# Patient Record
Sex: Male | Born: 1975 | Race: Black or African American | Hispanic: No | Marital: Single | Smoking: Current every day smoker
Health system: Southern US, Community
[De-identification: ages and names within clinical notes are randomized; demographics above are authoritative.]

## PROBLEM LIST (undated history)

## (undated) DIAGNOSIS — Z9889 Other specified postprocedural states: Secondary | ICD-10-CM

## (undated) HISTORY — PX: SHOULDER SURGERY: SHX246

---

## 2000-09-08 ENCOUNTER — Emergency Department (HOSPITAL_COMMUNITY): Admission: EM | Admit: 2000-09-08 | Discharge: 2000-09-08 | Payer: Self-pay | Admitting: Emergency Medicine

## 2000-09-08 ENCOUNTER — Encounter: Payer: Self-pay | Admitting: Emergency Medicine

## 2000-09-26 ENCOUNTER — Ambulatory Visit (HOSPITAL_COMMUNITY): Admission: RE | Admit: 2000-09-26 | Discharge: 2000-09-26 | Payer: Self-pay | Admitting: Orthopedic Surgery

## 2000-09-26 ENCOUNTER — Encounter: Payer: Self-pay | Admitting: Orthopedic Surgery

## 2000-10-10 ENCOUNTER — Observation Stay (HOSPITAL_COMMUNITY): Admission: RE | Admit: 2000-10-10 | Discharge: 2000-10-12 | Payer: Self-pay | Admitting: Orthopedic Surgery

## 2000-11-07 ENCOUNTER — Encounter (HOSPITAL_COMMUNITY): Admission: RE | Admit: 2000-11-07 | Discharge: 2000-12-07 | Payer: Self-pay | Admitting: Orthopedic Surgery

## 2001-06-23 ENCOUNTER — Emergency Department (HOSPITAL_COMMUNITY): Admission: EM | Admit: 2001-06-23 | Discharge: 2001-06-23 | Payer: Self-pay | Admitting: *Deleted

## 2002-04-19 ENCOUNTER — Emergency Department (HOSPITAL_COMMUNITY): Admission: EM | Admit: 2002-04-19 | Discharge: 2002-04-19 | Payer: Self-pay | Admitting: Emergency Medicine

## 2002-04-20 ENCOUNTER — Emergency Department (HOSPITAL_COMMUNITY): Admission: EM | Admit: 2002-04-20 | Discharge: 2002-04-20 | Payer: Self-pay | Admitting: Emergency Medicine

## 2003-03-11 ENCOUNTER — Emergency Department (HOSPITAL_COMMUNITY): Admission: EM | Admit: 2003-03-11 | Discharge: 2003-03-11 | Payer: Self-pay | Admitting: Emergency Medicine

## 2004-11-26 ENCOUNTER — Emergency Department (HOSPITAL_COMMUNITY): Admission: EM | Admit: 2004-11-26 | Discharge: 2004-11-26 | Payer: Self-pay | Admitting: *Deleted

## 2005-06-16 ENCOUNTER — Emergency Department (HOSPITAL_COMMUNITY): Admission: EM | Admit: 2005-06-16 | Discharge: 2005-06-17 | Payer: Self-pay | Admitting: Emergency Medicine

## 2005-08-31 ENCOUNTER — Emergency Department (HOSPITAL_COMMUNITY): Admission: EM | Admit: 2005-08-31 | Discharge: 2005-08-31 | Payer: Self-pay | Admitting: Emergency Medicine

## 2006-06-04 ENCOUNTER — Emergency Department (HOSPITAL_COMMUNITY): Admission: EM | Admit: 2006-06-04 | Discharge: 2006-06-04 | Payer: Self-pay | Admitting: Emergency Medicine

## 2006-06-11 ENCOUNTER — Emergency Department (HOSPITAL_COMMUNITY): Admission: EM | Admit: 2006-06-11 | Discharge: 2006-06-11 | Payer: Self-pay | Admitting: Emergency Medicine

## 2006-06-29 ENCOUNTER — Emergency Department (HOSPITAL_COMMUNITY): Admission: EM | Admit: 2006-06-29 | Discharge: 2006-06-29 | Payer: Self-pay | Admitting: Emergency Medicine

## 2006-06-30 ENCOUNTER — Ambulatory Visit: Payer: Self-pay | Admitting: Orthopedic Surgery

## 2006-07-08 ENCOUNTER — Ambulatory Visit: Payer: Self-pay | Admitting: Orthopedic Surgery

## 2006-08-11 ENCOUNTER — Ambulatory Visit: Payer: Self-pay | Admitting: Orthopedic Surgery

## 2006-10-02 ENCOUNTER — Ambulatory Visit: Payer: Self-pay | Admitting: Orthopedic Surgery

## 2008-03-24 ENCOUNTER — Emergency Department (HOSPITAL_COMMUNITY): Admission: EM | Admit: 2008-03-24 | Discharge: 2008-03-24 | Payer: Self-pay | Admitting: Emergency Medicine

## 2008-06-18 ENCOUNTER — Emergency Department (HOSPITAL_COMMUNITY): Admission: EM | Admit: 2008-06-18 | Discharge: 2008-06-18 | Payer: Self-pay | Admitting: Emergency Medicine

## 2010-04-23 ENCOUNTER — Emergency Department (HOSPITAL_COMMUNITY)
Admission: EM | Admit: 2010-04-23 | Discharge: 2010-04-24 | Disposition: A | Discharge status: Home / Self Care | Payer: Self-pay | Attending: Emergency Medicine | Admitting: Emergency Medicine

## 2010-04-23 DIAGNOSIS — T148XXA Other injury of unspecified body region, initial encounter: Secondary | ICD-10-CM | POA: Insufficient documentation

## 2010-04-23 DIAGNOSIS — X500XXA Overexertion from strenuous movement or load, initial encounter: Secondary | ICD-10-CM | POA: Insufficient documentation

## 2010-04-23 DIAGNOSIS — F17200 Nicotine dependence, unspecified, uncomplicated: Secondary | ICD-10-CM | POA: Insufficient documentation

## 2010-04-23 DIAGNOSIS — IMO0001 Reserved for inherently not codable concepts without codable children: Secondary | ICD-10-CM | POA: Insufficient documentation

## 2010-04-23 DIAGNOSIS — M549 Dorsalgia, unspecified: Secondary | ICD-10-CM | POA: Insufficient documentation

## 2010-04-23 DIAGNOSIS — Y929 Unspecified place or not applicable: Secondary | ICD-10-CM | POA: Insufficient documentation

## 2010-04-23 DIAGNOSIS — M25519 Pain in unspecified shoulder: Secondary | ICD-10-CM | POA: Insufficient documentation

## 2010-05-29 ENCOUNTER — Emergency Department (HOSPITAL_COMMUNITY)
Admission: EM | Admit: 2010-05-29 | Discharge: 2010-05-29 | Disposition: A | Discharge status: Home / Self Care | Payer: Self-pay | Attending: Emergency Medicine | Admitting: Emergency Medicine

## 2010-05-29 DIAGNOSIS — L0231 Cutaneous abscess of buttock: Secondary | ICD-10-CM | POA: Insufficient documentation

## 2010-06-02 LAB — CULTURE, ROUTINE-ABSCESS

## 2010-10-27 ENCOUNTER — Encounter: Payer: Self-pay | Admitting: *Deleted

## 2010-10-27 ENCOUNTER — Emergency Department (HOSPITAL_COMMUNITY): Payer: Self-pay

## 2010-10-27 ENCOUNTER — Emergency Department (HOSPITAL_COMMUNITY)
Admission: EM | Admit: 2010-10-27 | Discharge: 2010-10-27 | Disposition: A | Discharge status: Home / Self Care | Payer: Self-pay | Attending: Emergency Medicine | Admitting: Emergency Medicine

## 2010-10-27 DIAGNOSIS — M25469 Effusion, unspecified knee: Secondary | ICD-10-CM | POA: Insufficient documentation

## 2010-10-27 DIAGNOSIS — S0003XA Contusion of scalp, initial encounter: Secondary | ICD-10-CM | POA: Insufficient documentation

## 2010-10-27 DIAGNOSIS — F17200 Nicotine dependence, unspecified, uncomplicated: Secondary | ICD-10-CM | POA: Insufficient documentation

## 2010-10-27 DIAGNOSIS — S1093XA Contusion of unspecified part of neck, initial encounter: Secondary | ICD-10-CM | POA: Insufficient documentation

## 2010-10-27 DIAGNOSIS — M25569 Pain in unspecified knee: Secondary | ICD-10-CM | POA: Insufficient documentation

## 2010-10-27 DIAGNOSIS — S0083XA Contusion of other part of head, initial encounter: Secondary | ICD-10-CM

## 2010-10-27 DIAGNOSIS — IMO0002 Reserved for concepts with insufficient information to code with codable children: Secondary | ICD-10-CM | POA: Insufficient documentation

## 2010-10-27 DIAGNOSIS — M25461 Effusion, right knee: Secondary | ICD-10-CM

## 2010-10-27 MED ORDER — HYDROCODONE-ACETAMINOPHEN 5-325 MG PO TABS
1.0000 | ORAL_TABLET | Freq: Once | ORAL | Status: AC
  Administered 2010-10-27: 1 via ORAL
  Filled 2010-10-27: qty 1

## 2010-10-27 MED ORDER — HYDROCODONE-ACETAMINOPHEN 5-325 MG PO TABS: ORAL_TABLET | ORAL | Status: DC

## 2010-10-27 MED ORDER — IBUPROFEN 800 MG PO TABS
800.0000 mg | ORAL_TABLET | Freq: Once | ORAL | Status: AC
  Administered 2010-10-27: 800 mg via ORAL
  Filled 2010-10-27 (×2): qty 1

## 2010-10-27 MED ORDER — LIDOCAINE-EPINEPHRINE (PF) 1 %-1:200000 IJ SOLN
INTRAMUSCULAR | Status: AC
  Filled 2010-10-27: qty 10

## 2010-10-27 MED ORDER — TETANUS-DIPHTHERIA TOXOIDS TD 5-2 LFU IM INJ
0.5000 mL | INJECTION | Freq: Once | INTRAMUSCULAR | Status: AC
  Administered 2010-10-27: 0.5 mL via INTRAMUSCULAR
  Filled 2010-10-27: qty 0.5

## 2010-10-27 NOTE — ED Notes (Signed)
Pt c/o right knee and left shoulder pain. Pt has goose egg to left forehead.
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
## 2010-10-27 NOTE — ED Provider Notes (Signed)
Medical screening examination/treatment/procedure(s) were performed by non-physician practitioner and as supervising physician I was immediately available for consultation/collaboration.  Donnetta Hutching, MD 10/27/10 [phone]

## 2010-11-01 ENCOUNTER — Emergency Department (HOSPITAL_COMMUNITY)
Admission: EM | Admit: 2010-11-01 | Discharge: 2010-11-02 | Disposition: A | Discharge status: Home / Self Care | Payer: Self-pay | Attending: Emergency Medicine | Admitting: Emergency Medicine

## 2010-11-01 DIAGNOSIS — F17200 Nicotine dependence, unspecified, uncomplicated: Secondary | ICD-10-CM | POA: Insufficient documentation

## 2010-11-01 DIAGNOSIS — Z9889 Other specified postprocedural states: Secondary | ICD-10-CM | POA: Insufficient documentation

## 2010-11-01 DIAGNOSIS — M25569 Pain in unspecified knee: Secondary | ICD-10-CM | POA: Insufficient documentation

## 2010-11-01 DIAGNOSIS — M25469 Effusion, unspecified knee: Secondary | ICD-10-CM | POA: Insufficient documentation

## 2010-11-01 DIAGNOSIS — M7989 Other specified soft tissue disorders: Secondary | ICD-10-CM | POA: Insufficient documentation

## 2010-11-01 DIAGNOSIS — M79609 Pain in unspecified limb: Secondary | ICD-10-CM | POA: Insufficient documentation

## 2010-11-02 ENCOUNTER — Emergency Department (HOSPITAL_COMMUNITY): Payer: Self-pay

## 2011-01-29 ENCOUNTER — Emergency Department (HOSPITAL_COMMUNITY)
Admission: EM | Admit: 2011-01-29 | Discharge: 2011-01-29 | Disposition: A | Discharge status: Home / Self Care | Payer: Self-pay | Attending: Emergency Medicine | Admitting: Emergency Medicine

## 2011-01-29 ENCOUNTER — Encounter (HOSPITAL_COMMUNITY): Payer: Self-pay | Admitting: Emergency Medicine

## 2011-01-29 DIAGNOSIS — J029 Acute pharyngitis, unspecified: Secondary | ICD-10-CM | POA: Insufficient documentation

## 2011-01-29 DIAGNOSIS — F17200 Nicotine dependence, unspecified, uncomplicated: Secondary | ICD-10-CM | POA: Insufficient documentation

## 2011-01-29 MED ORDER — HYDROCODONE-ACETAMINOPHEN 5-325 MG PO TABS
1.0000 | ORAL_TABLET | Freq: Once | ORAL | Status: AC
  Administered 2011-01-29: 1 via ORAL
  Filled 2011-01-29: qty 1

## 2011-01-29 MED ORDER — PENICILLIN V POTASSIUM 500 MG PO TABS: 500.0000 mg | ORAL_TABLET | Freq: Four times a day (QID) | ORAL | Status: AC

## 2011-01-29 MED ORDER — HYDROCODONE-ACETAMINOPHEN 5-325 MG PO TABS: 1.0000 | ORAL_TABLET | Freq: Four times a day (QID) | ORAL | Status: AC | PRN

## 2011-01-29 MED ORDER — PENICILLIN V POTASSIUM 250 MG PO TABS
500.0000 mg | ORAL_TABLET | Freq: Once | ORAL | Status: AC
  Administered 2011-01-29: 500 mg via ORAL
  Filled 2011-01-29: qty 2

## 2011-01-29 MED ORDER — IBUPROFEN 800 MG PO TABS
800.0000 mg | ORAL_TABLET | Freq: Once | ORAL | Status: AC
  Administered 2011-01-29: 800 mg via ORAL
  Filled 2011-01-29: qty 1

## 2011-01-29 NOTE — ED Notes (Signed)
Pt c/o left side of throat soreness since this morning and states with low grade fever in triage

## 2011-01-29 NOTE — ED Notes (Signed)
Patient states he woke this morning with a "bad sore throat". States it is painful to swallow. Denies nausea or vomiting.

## 2011-01-29 NOTE — ED Provider Notes (Signed)
History     CSN: [id number] Arrival date & time: 01/29/2011  8:20 PM   First MD Initiated Contact with Patient 01/29/11 2022      Chief Complaint  Patient presents with  . Sore Throat    (Consider location/radiation/quality/duration/timing/severity/associated sxs/prior treatment) Patient is a 35 y.o. male presenting with pharyngitis. The history is provided by the patient and the spouse. No language interpreter was used.  Sore Throat This is a new problem. Episode onset: several days. The problem occurs constantly. The problem has been gradually worsening. Associated symptoms include a sore throat. Pertinent negatives include no chills or fever. The symptoms are aggravated by swallowing. He has tried acetaminophen and drinking for the symptoms. The treatment provided no relief.    History reviewed. No pertinent past medical history.  Past Surgical History  Procedure Date  . Shoulder surgery     History reviewed. No pertinent family history.  History  Substance Use Topics  . Smoking status: Current Everyday Smoker -- 1.0 packs/day  . Smokeless tobacco: Not on file  . Alcohol Use: Yes     occasionally      Review of Systems  Constitutional: Negative for fever and chills.  HENT: Positive for sore throat.   All other systems reviewed and are negative.    Allergies  Review of patient's allergies indicates no known allergies.  Home Medications   Current Outpatient Rx  Name Route Sig Dispense Refill  . CARBAMIDE PEROXIDE 6.5 % OT SOLN Left Ear Place 1-2 drops into the left ear 2 (two) times daily as needed. For pain       BP 140/93  Pulse 79  Temp(Src) 99 F (37.2 C) (Oral)  Resp 16  Ht 5\' 5"  (1.651 m)  Wt 130 lb (58.968 kg)  BMI 21.63 kg/m2  SpO2 100%  Physical Exam  Nursing note and vitals reviewed. Constitutional: He is oriented to person, place, and time. He appears well-developed and well-nourished.  HENT:  Head: Normocephalic and atraumatic. No  trismus in the jaw.  Right Ear: External ear normal.  Left Ear: External ear normal.  Nose: Nose normal.  Mouth/Throat: No uvula swelling. No oropharyngeal exudate or posterior oropharyngeal erythema.       Posterior pharynx difficult to visualize because of the pt's inability to lower his tongue or to allow full use of a tongue depressor.  It appears redder on the L than the R.  i don't perceive any fullness or uvular deviation.  Eyes: EOM are normal.  Neck: Normal range of motion and full passive range of motion without pain. Neck supple.  Cardiovascular: Normal rate, regular rhythm, normal heart sounds and intact distal pulses.   Pulmonary/Chest: Effort normal and breath sounds normal. No respiratory distress.  Abdominal: Soft. He exhibits no distension. There is no tenderness.  Musculoskeletal: Normal range of motion.  Neurological: He is alert and oriented to person, place, and time.  Skin: Skin is warm and dry.  Psychiatric: He has a normal mood and affect. Judgment normal.    ED Course  Procedures (including critical care time)   Labs Reviewed  RAPID STREP SCREEN   No results found.   No diagnosis found.    MDM          Worthy Rancher, PA 01/29/11 [phone]

## 2011-01-30 NOTE — ED Provider Notes (Signed)
Medical screening examination/treatment/procedure(s) were performed by non-physician practitioner and as supervising physician I was immediately available for consultation/collaboration.  Nicoletta Dress. Colon Branch, MD 01/30/11 1313

## 2012-06-02 ENCOUNTER — Encounter (HOSPITAL_COMMUNITY): Payer: Self-pay | Admitting: *Deleted

## 2012-06-02 ENCOUNTER — Emergency Department (HOSPITAL_COMMUNITY)
Admission: EM | Admit: 2012-06-02 | Discharge: 2012-06-03 | Disposition: A | Discharge status: Home / Self Care | Payer: Self-pay | Attending: Emergency Medicine | Admitting: Emergency Medicine

## 2012-06-02 DIAGNOSIS — N50811 Right testicular pain: Secondary | ICD-10-CM

## 2012-06-02 DIAGNOSIS — N452 Orchitis: Secondary | ICD-10-CM

## 2012-06-02 DIAGNOSIS — N509 Disorder of male genital organs, unspecified: Secondary | ICD-10-CM | POA: Insufficient documentation

## 2012-06-02 DIAGNOSIS — F17200 Nicotine dependence, unspecified, uncomplicated: Secondary | ICD-10-CM | POA: Insufficient documentation

## 2012-06-02 DIAGNOSIS — N453 Epididymo-orchitis: Secondary | ICD-10-CM | POA: Insufficient documentation

## 2012-06-02 NOTE — ED Notes (Signed)
C/o right testicle swelling and pain since sat after cleaning up trees outside

## 2012-06-03 ENCOUNTER — Ambulatory Visit (HOSPITAL_COMMUNITY)
Admit: 2012-06-03 | Discharge: 2012-06-03 | Disposition: A | Discharge status: Home / Self Care | Payer: Self-pay | Source: Ambulatory Visit | Attending: Emergency Medicine | Admitting: Emergency Medicine

## 2012-06-03 ENCOUNTER — Other Ambulatory Visit (HOSPITAL_COMMUNITY): Payer: Self-pay | Admitting: Emergency Medicine

## 2012-06-03 DIAGNOSIS — N433 Hydrocele, unspecified: Secondary | ICD-10-CM | POA: Insufficient documentation

## 2012-06-03 DIAGNOSIS — N509 Disorder of male genital organs, unspecified: Secondary | ICD-10-CM | POA: Insufficient documentation

## 2012-06-03 DIAGNOSIS — N452 Orchitis: Secondary | ICD-10-CM

## 2012-06-03 DIAGNOSIS — N508 Other specified disorders of male genital organs: Secondary | ICD-10-CM | POA: Insufficient documentation

## 2012-06-03 MED ORDER — AZITHROMYCIN 250 MG PO TABS
2000.0000 mg | ORAL_TABLET | Freq: Once | ORAL | Status: AC
  Administered 2012-06-03: 2000 mg via ORAL
  Filled 2012-06-03: qty 8

## 2012-06-03 MED ORDER — CEFTRIAXONE SODIUM 250 MG IJ SOLR
250.0000 mg | Freq: Once | INTRAMUSCULAR | Status: AC
  Administered 2012-06-03: 250 mg via INTRAMUSCULAR
  Filled 2012-06-03: qty 250

## 2012-06-03 NOTE — ED Notes (Signed)
MD at bedside. 

## 2012-06-03 NOTE — ED Provider Notes (Signed)
History     CSN: [id number]  Arrival date & time 06/02/12  1308   First MD Initiated Contact with Patient 06/02/12 2212      Chief Complaint  Patient presents with  . Inguinal Hernia    (Consider location/radiation/quality/duration/timing/severity/associated sxs/prior treatment) HPI Christian Ali is a 37 y.o. male presents with right testicle swelling and pain that started last Thursday while working outside. Says he was cleaning up trees and lifting heavy objects and he is concerned he has a hernia. His pain is severe, nonradiating, it is alleviated somewhat by wearing supportive underwear, this is been going on for days, he's had no vomiting, no fevers or chills. No penile discharge.    History reviewed. No pertinent past medical history.  Past Surgical History  Procedure Laterality Date  . Shoulder surgery      History reviewed. No pertinent family history.  History  Substance Use Topics  . Smoking status: Current Every Day Smoker -- 1.00 packs/day    Types: Cigarettes  . Smokeless tobacco: Not on file  . Alcohol Use: Yes     Comment: occasionally      Review of Systems At least 10pt or greater review of systems completed and are negative except where specified in the HPI.  Allergies  Review of patient's allergies indicates no known allergies.  Home Medications  No current outpatient prescriptions on file.  BP 117/70  Pulse 86  Temp(Src) 97.7 F (36.5 C) (Oral)  Resp 18  Ht 5\' 5"  (1.651 m)  Wt 148 lb (67.132 kg)  BMI 24.63 kg/m2  SpO2 100%  Physical Exam  Nursing notes reviewed.  Electronic medical record reviewed. VITAL SIGNS:   Filed Vitals:   06/02/12 2001 06/02/12 2004 06/03/12 0109  BP: 117/70  142/98  Pulse: 86  83  Temp: 97.7 F (36.5 C)    TempSrc: Oral    Resp: 18  20  Height: 5\' 5"  (1.651 m) 5\' 5"  (1.651 m)   Weight: 148 lb (67.132 kg) 148 lb (67.132 kg)   SpO2: 100%  99%   CONSTITUTIONAL: Awake, oriented, appears  non-toxic HENT: Atraumatic, normocephalic, oral mucosa pink and moist, airway patent. Nares patent without drainage. External ears normal. EYES: Conjunctiva clear, EOMI, PERRLA NECK: Trachea midline, non-tender, supple CARDIOVASCULAR: Normal heart rate, Normal rhythm, No murmurs, rubs, gallops PULMONARY/CHEST: Clear to auscultation, no rhonchi, wheezes, or rales. Symmetrical breath sounds. Non-tender. ABDOMINAL: Non-distended, soft, non-tender - no rebound or guarding.  BS normal. NEUROLOGIC: Non-focal, moving all four extremities, no gross sensory or motor deficits. GU: Normal circumcised male, no discharge, no rash or sores, right testicle is significantly enlarged compared to left testicle, it is firm and tender to palpation. Normal lie. No hernias appreciated. EXTREMITIES: No clubbing, cyanosis, or edema SKIN: Warm, Dry, No erythema, No rash  ED Course  Procedures (including critical care time)  Labs Reviewed - No data to display No results found.   1. Orchitis, right   2. Pain in right testicle       MDM  Physical examination suggest acute orchitis. Do not palpate a hernia. Bedside ultrasound examination shows a collection of fluid likely hydrocele, good blood flow to the right testicle, I do not suspect torsion.  We'll prescribe the patient antibiotics for likely orchitis and have him followup tomorrow with a non-emergent ultrasound.  I explained the diagnosis and have given explicit precautions to return to the ER including worsening pain, vomiting or any other new or worsening symptoms. The patient understands  and accepts the medical plan as it's been dictated and I have answered their questions. Discharge instructions concerning home care and prescriptions have been given.  The patient is STABLE and is discharged to home in good condition.         Jones Skene, MD 06/09/12 [phone]

## 2012-08-04 ENCOUNTER — Emergency Department (HOSPITAL_COMMUNITY)
Admission: EM | Admit: 2012-08-04 | Discharge: 2012-08-04 | Disposition: A | Discharge status: Home / Self Care | Payer: No Typology Code available for payment source | Attending: Emergency Medicine | Admitting: Emergency Medicine

## 2012-08-04 ENCOUNTER — Emergency Department (HOSPITAL_COMMUNITY): Payer: No Typology Code available for payment source

## 2012-08-04 ENCOUNTER — Encounter (HOSPITAL_COMMUNITY): Payer: Self-pay | Admitting: *Deleted

## 2012-08-04 DIAGNOSIS — Y9389 Activity, other specified: Secondary | ICD-10-CM | POA: Insufficient documentation

## 2012-08-04 DIAGNOSIS — S9000XA Contusion of unspecified ankle, initial encounter: Secondary | ICD-10-CM | POA: Insufficient documentation

## 2012-08-04 DIAGNOSIS — Z23 Encounter for immunization: Secondary | ICD-10-CM | POA: Insufficient documentation

## 2012-08-04 DIAGNOSIS — S39012A Strain of muscle, fascia and tendon of lower back, initial encounter: Secondary | ICD-10-CM

## 2012-08-04 DIAGNOSIS — S335XXA Sprain of ligaments of lumbar spine, initial encounter: Secondary | ICD-10-CM | POA: Insufficient documentation

## 2012-08-04 DIAGNOSIS — S9001XA Contusion of right ankle, initial encounter: Secondary | ICD-10-CM

## 2012-08-04 DIAGNOSIS — Y92410 Unspecified street and highway as the place of occurrence of the external cause: Secondary | ICD-10-CM | POA: Insufficient documentation

## 2012-08-04 DIAGNOSIS — F17200 Nicotine dependence, unspecified, uncomplicated: Secondary | ICD-10-CM | POA: Insufficient documentation

## 2012-08-04 MED ORDER — HYDROCODONE-ACETAMINOPHEN 5-325 MG PO TABS: 1.0000 | ORAL_TABLET | ORAL | Status: DC | PRN

## 2012-08-04 MED ORDER — TETANUS-DIPHTH-ACELL PERTUSSIS 5-2.5-18.5 LF-MCG/0.5 IM SUSP
0.5000 mL | Freq: Once | INTRAMUSCULAR | Status: AC
  Administered 2012-08-04: 0.5 mL via INTRAMUSCULAR
  Filled 2012-08-04: qty 0.5

## 2012-08-04 MED ORDER — NAPROXEN 375 MG PO TABS: 375.0000 mg | ORAL_TABLET | Freq: Two times a day (BID) | ORAL | Status: DC

## 2012-08-04 MED ORDER — BACITRACIN-NEOMYCIN-POLYMYXIN 400-5-5000 EX OINT
TOPICAL_OINTMENT | Freq: Once | CUTANEOUS | Status: DC
  Filled 2012-08-04: qty 2
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
## 2012-08-04 NOTE — ED Provider Notes (Signed)
Medical screening examination/treatment/procedure(s) were performed by non-physician practitioner and as supervising physician I was immediately available for consultation/collaboration. Devoria Albe, MD, Armando Gang   Ward Givens, MD 08/04/12 1910

## 2013-09-25 ENCOUNTER — Emergency Department (HOSPITAL_COMMUNITY)
Admission: EM | Admit: 2013-09-25 | Discharge: 2013-09-25 | Disposition: A | Discharge status: Home / Self Care | Payer: No Typology Code available for payment source | Attending: Emergency Medicine | Admitting: Emergency Medicine

## 2013-09-25 ENCOUNTER — Encounter (HOSPITAL_COMMUNITY): Payer: Self-pay | Admitting: Emergency Medicine

## 2013-09-25 DIAGNOSIS — L0201 Cutaneous abscess of face: Secondary | ICD-10-CM | POA: Insufficient documentation

## 2013-09-25 DIAGNOSIS — Z791 Long term (current) use of non-steroidal anti-inflammatories (NSAID): Secondary | ICD-10-CM | POA: Insufficient documentation

## 2013-09-25 DIAGNOSIS — L03211 Cellulitis of face: Principal | ICD-10-CM

## 2013-09-25 DIAGNOSIS — F17200 Nicotine dependence, unspecified, uncomplicated: Secondary | ICD-10-CM | POA: Insufficient documentation

## 2013-09-25 MED ORDER — LIDOCAINE-EPINEPHRINE (PF) 1 %-1:200000 IJ SOLN: 10.0000 mL | Freq: Once | INTRAMUSCULAR | Status: AC

## 2013-09-25 MED ORDER — LIDOCAINE-EPINEPHRINE (PF) 1 %-1:200000 IJ SOLN
INTRAMUSCULAR | Status: AC
  Administered 2013-09-25: 10 mL via INTRADERMAL
  Filled 2013-09-25: qty 10

## 2013-09-25 MED ORDER — SULFAMETHOXAZOLE-TMP DS 800-160 MG PO TABS: 1.0000 | ORAL_TABLET | Freq: Two times a day (BID) | ORAL | Status: DC

## 2013-09-25 MED ORDER — LIDOCAINE-EPINEPHRINE 2 %-1:100000 IJ SOLN
20.0000 mL | Freq: Once | INTRAMUSCULAR | Status: DC
  Filled 2013-09-25: qty 20

## 2013-09-25 MED ORDER — HYDROCODONE-ACETAMINOPHEN 5-325 MG PO TABS: ORAL_TABLET | ORAL | Status: DC

## 2013-09-25 NOTE — ED Provider Notes (Signed)
CSN: [id number]     Arrival date & time 09/25/13  1647 History   First MD Initiated Contact with Patient 09/25/13 1734     Chief Complaint  Patient presents with  . Abscess     (Consider location/radiation/quality/duration/timing/severity/associated sxs/prior Treatment) HPI  Christian Ali is a 38 y.o. male complaining of painful (8/10, no pain medication prior to arrival, exacerbated by palpation)  boil to right jaw worsening over the course of 3 days with purulent drainage. Patient denies personal or family history of diabetes, fever, chills, shortness of breath, nausea vomiting. Patient had similar episode with a gluteal abscess.  History reviewed. No pertinent past medical history. Past Surgical History  Procedure Laterality Date  . Shoulder surgery     No family history on file. History  Substance Use Topics  . Smoking status: Current Every Day Smoker -- 1.00 packs/day    Types: Cigarettes  . Smokeless tobacco: Not on file  . Alcohol Use: Yes     Comment: occasionally    Review of Systems  10 systems reviewed and found to be negative, except as noted in the HPI.   Allergies  Review of patient's allergies indicates no known allergies.  Home Medications   Prior to Admission medications   Medication Sig Start Date End Date Taking? Authorizing Provider  HYDROcodone-acetaminophen (NORCO/VICODIN) 5-325 MG per tablet Take 1 tablet by mouth every 4 (four) hours as needed. 08/04/12   Hope Orlene OchM Neese, NP  HYDROcodone-acetaminophen (NORCO/VICODIN) 5-325 MG per tablet Take 1-2 tablets by mouth every 6 hours as needed for pain. 09/25/13   Francis Yardley, PA-C  naproxen (NAPROSYN) 375 MG tablet Take 1 tablet (375 mg total) by mouth 2 (two) times daily. 08/04/12   Hope Orlene OchM Neese, NP  sulfamethoxazole-trimethoprim (BACTRIM DS) 800-160 MG per tablet Take 1 tablet by mouth 2 (two) times daily. 09/25/13   Calissa Swenor, PA-C   BP 145/91  Pulse 68  Temp(Src) 99.3 F (37.4 C) (Oral)   Resp 16  Ht 5\' 6"  (1.676 m)  Wt 140 lb (63.504 kg)  BMI 22.61 kg/m2  SpO2 98% Physical Exam  Nursing note and vitals reviewed. Constitutional: He is oriented to person, place, and time. He appears well-developed and well-nourished. No distress.  HENT:  Head: Normocephalic and atraumatic.    Mouth/Throat: Oropharynx is clear and moist.  Eyes: Conjunctivae and EOM are normal. Pupils are equal, round, and reactive to light.  Neck: Normal range of motion. Neck supple.  Cardiovascular: Normal rate, regular rhythm and intact distal pulses.   Pulmonary/Chest: Effort normal and breath sounds normal. No stridor. No respiratory distress. He has no wheezes. He has no rales. He exhibits no tenderness.  Abdominal: Soft.  Musculoskeletal: Normal range of motion.  Lymphadenopathy:    He has cervical adenopathy.  Neurological: He is alert and oriented to person, place, and time.  Psychiatric: He has a normal mood and affect.    ED Course  INCISION AND DRAINAGE Date/Time: 09/25/2013 6:22 PM Performed by: Wynetta EmeryPISCIOTTA, Keerthana Vanrossum Authorized by: Wynetta EmeryPISCIOTTA, Daira Hine Consent: Verbal consent obtained. Consent given by: patient Patient identity confirmed: verbally with patient Type: abscess Body area: head/neck Local anesthetic: lidocaine 1% with epinephrine Anesthetic total: 1 ml Patient sedated: no Scalpel size: 11 Incision type: elliptical Complexity: simple Drainage: purulent and  bloody Drainage amount: moderate Wound treatment: wound left open Packing material: none Patient tolerance: Patient tolerated the procedure well with no immediate complications.   (including critical care time) Labs Review Labs Reviewed - No data  to display  Imaging Review No results found.   EKG Interpretation None      MDM   Final diagnoses:  Facial abscess    Filed Vitals:   09/25/13 1657 09/25/13 1809  BP: 128/91 145/91  Pulse: 72 68  Temp: 99.3 F (37.4 C)   TempSrc: Oral   Resp: 18 16    Height: 5\' 6"  (1.676 m)   Weight: 140 lb (63.504 kg)   SpO2: 99% 98%    Medications  lidocaine-EPINEPHrine (XYLOCAINE-EPINEPHrine) 1 %-1:200000 (PF) injection 10 mL (10 mLs Intradermal Given 09/25/13 1755)    Christian Ali is a 38 y.o. male presenting with abscess to the right mandible. There is no surrounding cellulitis. I&D performed with moderate expression of pertinent material. Patient will be started on antibiotics and given pain medication. Wound care and return precautions discussed.   Evaluation does not show pathology that would require ongoing emergent intervention or inpatient treatment. Pt is hemodynamically stable and mentating appropriately. Discussed findings and plan with patient/guardian, who agrees with care plan. All questions answered. Return precautions discussed and outpatient follow up given.   Discharge Medication List as of 09/25/2013  6:06 PM    START taking these medications   Details  !! HYDROcodone-acetaminophen (NORCO/VICODIN) 5-325 MG per tablet Take 1-2 tablets by mouth every 6 hours as needed for pain., Print    sulfamethoxazole-trimethoprim (BACTRIM DS) 800-160 MG per tablet Take 1 tablet by mouth 2 (two) times daily., Starting 09/25/2013, Until Discontinued, Print     !! - Potential duplicate medications found. Please discuss with provider.           Wynetta Emery, PA-C 09/25/13 1831

## 2013-09-25 NOTE — Discharge Instructions (Signed)
Take vicodin for breakthrough pain, do not drink alcohol, drive, care for children or do other critical tasks while taking vicodin.  Take your antibiotics as directed and to completion. You should never have any leftover antibiotics! Push fluids and stay well hydrated.   If you see worsening signs of infection (warmth, redness, tenderness, pus, sharp increase in pain, fever, red streaking) immediately return to the emergency department.

## 2013-09-25 NOTE — ED Notes (Signed)
Pt c/o abscess to right jaw x 3 days.

## 2013-09-26 NOTE — ED Provider Notes (Signed)
Medical screening examination/treatment/procedure(s) were performed by non-physician practitioner and as supervising physician I was immediately available for consultation/collaboration.    Jonathen Rathman, MD 09/26/13 0018 

## 2015-01-21 ENCOUNTER — Encounter (HOSPITAL_COMMUNITY): Payer: Self-pay

## 2015-01-21 ENCOUNTER — Emergency Department (HOSPITAL_COMMUNITY)
Admission: EM | Admit: 2015-01-21 | Discharge: 2015-01-21 | Disposition: A | Discharge status: Home / Self Care | Payer: Self-pay | Attending: Emergency Medicine | Admitting: Emergency Medicine

## 2015-01-21 ENCOUNTER — Emergency Department (HOSPITAL_COMMUNITY): Payer: Self-pay

## 2015-01-21 DIAGNOSIS — M7989 Other specified soft tissue disorders: Secondary | ICD-10-CM | POA: Insufficient documentation

## 2015-01-21 DIAGNOSIS — F17210 Nicotine dependence, cigarettes, uncomplicated: Secondary | ICD-10-CM | POA: Insufficient documentation

## 2015-01-21 LAB — CBC WITH DIFFERENTIAL/PLATELET
BASOS ABS: 0 10*3/uL (ref 0.0–0.1)
Basophils Relative: 0 %
Eosinophils Absolute: 0.2 10*3/uL (ref 0.0–0.7)
Eosinophils Relative: 3 %
HEMATOCRIT: 45.4 % (ref 39.0–52.0)
HEMOGLOBIN: 15.7 g/dL (ref 13.0–17.0)
LYMPHS PCT: 52 %
Lymphs Abs: 2.9 10*3/uL (ref 0.7–4.0)
MCH: 33.2 pg (ref 26.0–34.0)
MCHC: 34.6 g/dL (ref 30.0–36.0)
MCV: 96 fL (ref 78.0–100.0)
Monocytes Absolute: 0.4 10*3/uL (ref 0.1–1.0)
Monocytes Relative: 8 %
NEUTROS ABS: 2 10*3/uL (ref 1.7–7.7)
Neutrophils Relative %: 37 %
Platelets: 240 10*3/uL (ref 150–400)
RBC: 4.73 MIL/uL (ref 4.22–5.81)
RDW: 12.2 % (ref 11.5–15.5)
WBC: 5.5 10*3/uL (ref 4.0–10.5)

## 2015-01-21 LAB — BASIC METABOLIC PANEL
ANION GAP: 9 (ref 5–15)
BUN: 9 mg/dL (ref 6–20)
CHLORIDE: 105 mmol/L (ref 101–111)
CO2: 25 mmol/L (ref 22–32)
Calcium: 9.1 mg/dL (ref 8.9–10.3)
Creatinine, Ser: 1.02 mg/dL (ref 0.61–1.24)
GFR calc Af Amer: >60 mL/min (ref >60)
GLUCOSE: 142 mg/dL — AB (ref 65–99)
POTASSIUM: 3.6 mmol/L (ref 3.5–5.1)
Sodium: 139 mmol/L (ref 135–145)

## 2015-01-21 MED ORDER — IBUPROFEN 800 MG PO TABS: 800.0000 mg | ORAL_TABLET | Freq: Three times a day (TID) | ORAL | Status: DC

## 2015-01-21 MED ORDER — SULFAMETHOXAZOLE-TRIMETHOPRIM 800-160 MG PO TABS: 1.0000 | ORAL_TABLET | Freq: Two times a day (BID) | ORAL | Status: AC

## 2015-01-21 MED ORDER — SULFAMETHOXAZOLE-TRIMETHOPRIM 800-160 MG PO TABS
1.0000 | ORAL_TABLET | Freq: Once | ORAL | Status: AC
  Administered 2015-01-21: 1 via ORAL
  Filled 2015-01-21: qty 1

## 2015-01-21 MED ORDER — IBUPROFEN 800 MG PO TABS
800.0000 mg | ORAL_TABLET | Freq: Once | ORAL | Status: AC
  Administered 2015-01-21: 800 mg via ORAL
  Filled 2015-01-21: qty 1

## 2015-01-21 NOTE — Discharge Instructions (Signed)
Edema Edema is an abnormal buildup of fluids in your bodytissues. Edema is somewhatdependent on gravity to pull the fluid to the lowest place in your body. That makes the condition more common in the legs and thighs (lower extremities). Painless swelling of the feet and ankles is common and becomes more likely as you get older. It is also common in looser tissues, like around your eyes.  When the affected area is squeezed, the fluid may move out of that spot and leave a dent for a few moments. This dent is called pitting.  CAUSES  There are many possible causes of edema. Eating too much salt and being on your feet or sitting for a long time can cause edema in your legs and ankles. Hot weather may make edema worse. Common medical causes of edema include:  Heart failure.  Liver disease.  Kidney disease.  Weak blood vessels in your legs.  Cancer.  An injury.  Pregnancy.  Some medications.  Obesity. SYMPTOMS  Edema is usually painless.Your skin may look swollen or shiny.  DIAGNOSIS  Your health care provider may be able to diagnose edema by asking about your medical history and doing a physical exam. You may need to have tests such as X-rays, an electrocardiogram, or blood tests to check for medical conditions that may cause edema.  TREATMENT  Edema treatment depends on the cause. If you have heart, liver, or kidney disease, you need the treatment appropriate for these conditions. General treatment may include:  Elevation of the affected body part above the level of your heart. HOME CARE INSTRUCTIONS   Keep the affected body part above the level of your heart when you are lying down.   Do not sit still or stand for prolonged periods.   Do not put anything directly under your knees when lying down.  Do not wear constricting clothing or garters on your upper legs.   Exercise your legs to work the fluid back into your blood vessels. This may help the swelling go down.    Wear elastic bandages or support stockings to reduce ankle swelling as directed by your health care provider.   Eat a low-salt diet to reduce fluid if your health care provider recommends it.   Only take medicines as directed by your health care provider. SEEK MEDICAL CARE IF:   Your edema is not responding to treatment.  You have heart, liver, or kidney disease and notice symptoms of edema.  You have edema in your legs that does not improve after elevating them.   You have sudden and unexplained weight gain. SEEK IMMEDIATE MEDICAL CARE IF:   You develop shortness of breath or chest pain.   You cannot breathe when you lie down.  You develop pain, redness, or warmth in the swollen areas.   You have heart, liver, or kidney disease and suddenly get edema.  You have a fever and your symptoms suddenly get worse. MAKE SURE YOU:   Understand these instructions.  Will watch your condition.  Will get help right away if you are not doing well or get worse.   This information is not intended to replace advice given to you by your health care provider. Make sure you discuss any questions you have with your health care provider.   Document Released: 01/28/2005 Document Revised: 02/18/2014 Document Reviewed: 11/20/2012 Elsevier Interactive Patient Education 2016 [employer] Inc[employer]  Edema Edema is an abnormal buildup of fluids in your bodytissues. Edema is somewhatdependent on gravity to pull  the fluid to the lowest place in your body. That makes the condition more common in the legs and thighs (lower extremities). Painless swelling of the feet and ankles is common and becomes more likely as you get older. It is also common in looser tissues, like around your eyes.  When the affected area is squeezed, the fluid may move out of that spot and leave a dent for a few moments. This dent is called pitting.  CAUSES  There are many possible causes of edema. Eating too much salt and being  on your feet or sitting for a long time can cause edema in your legs and ankles. Hot weather may make edema worse. Common medical causes of edema include:  Heart failure.  Liver disease.  Kidney disease.  Weak blood vessels in your legs.  Cancer.  An injury.  Pregnancy.  Some medications.  Obesity. SYMPTOMS  Edema is usually painless.Your skin may look swollen or shiny.  DIAGNOSIS  Your health care provider may be able to diagnose edema by asking about your medical history and doing a physical exam. You may need to have tests such as X-rays, an electrocardiogram, or blood tests to check for medical conditions that may cause edema.  TREATMENT  Edema treatment depends on the cause. If you have heart, liver, or kidney disease, you need the treatment appropriate for these conditions. General treatment may include:  Elevation of the affected body part above the level of your heart.  Compression of the affected body part. Pressure from elastic bandages or support stockings squeezes the tissues and forces fluid back into the blood vessels. This keeps fluid from entering the tissues.  Restriction of fluid and salt intake.  Use of a water pill (diuretic). These medications are appropriate only for some types of edema. They pull fluid out of your body and make you urinate more often. This gets rid of fluid and reduces swelling, but diuretics can have side effects. Only use diuretics as directed by your health care provider. HOME CARE INSTRUCTIONS   Keep the affected body part above the level of your heart when you are lying down.   Do not sit still or stand for prolonged periods.   Do not put anything directly under your knees when lying down.  Do not wear constricting clothing or garters on your upper legs.   Exercise your legs to work the fluid back into your blood vessels. This may help the swelling go down.   Wear elastic bandages or support stockings to reduce ankle  swelling as directed by your health care provider.   Eat a low-salt diet to reduce fluid if your health care provider recommends it.   Only take medicines as directed by your health care provider. SEEK MEDICAL CARE IF:   Your edema is not responding to treatment.  You have heart, liver, or kidney disease and notice symptoms of edema.  You have edema in your legs that does not improve after elevating them.   You have sudden and unexplained weight gain. SEEK IMMEDIATE MEDICAL CARE IF:   You develop shortness of breath or chest pain.   You cannot breathe when you lie down.  You develop pain, redness, or warmth in the swollen areas.   You have heart, liver, or kidney disease and suddenly get edema.  You have a fever and your symptoms suddenly get worse. MAKE SURE YOU:   Understand these instructions.  Will watch your condition.  Will get help right away if  you are not doing well or get worse.   This information is not intended to replace advice given to you by your health care provider. Make sure you discuss any questions you have with your health care provider.   Document Released: 01/28/2005 Document Revised: 02/18/2014 Document Reviewed: 11/20/2012 Elsevier Interactive Patient Education 2016 [employer].   Take your next dose of the antibiotic tomorrow morning.  Elevate your hand as much as possible, keeping it above heart level to help with swelling.    As discussed, your labs and xray are ok tonight.  You may apply ice (sparingly) - 5 minutes every hour while awake.  You are being treated with antibiotics in the event this is a skin infection as discussed.

## 2015-01-21 NOTE — ED Notes (Signed)
Discharge papers reviewed with pt - Instructed on drinking large glass of H20 with abt, verbalized understanding. Instructed by PA to return tomorrow if hand worstens or he sees no improvement at all .

## 2015-01-21 NOTE — ED Notes (Signed)
My right hand was hurting yesterday and I woke up today and it was swollen.

## 2015-01-22 NOTE — ED Provider Notes (Signed)
CSN: [id number]     Arrival date & time 01/21/15  1900 History   First MD Initiated Contact with Patient 01/21/15 1924     Chief Complaint  Patient presents with  . Hand Problem     (Consider location/radiation/quality/duration/timing/severity/associated sxs/prior Treatment) The history is provided by the patient.   Christian Ali is a 39 y.o. right handed male presenting with right dorsal hand swelling since he woke this morning.  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  He felt sore along the dorsal hand yesterday and woke today with the same soreness along with swelling of the dorsal hand.  There is no radiation of pain or swelling into the wrist or forearm.  He has mild discomfort with flexing/extending his fingers but is able to make a full fist.  He has had no medicines prior to arrival.  He denies etoh and substance abuse, no IVDU.  He denies any recent skin wounds of the hand or fingers. He has had no medicines or treatment prior to arrival for this complaint.       History reviewed. No pertinent past medical history. Past Surgical History  Procedure Laterality Date  . Shoulder surgery     No family history on file. Social History  Substance Use Topics  . Smoking status: Current Every Day Smoker -- 1.00 packs/day    Types: Cigarettes  . Smokeless tobacco: None  . Alcohol Use: Yes     Comment: occasionally    Review of Systems  Constitutional: Negative for fever and chills.  Musculoskeletal: Positive for arthralgias. Negative for myalgias and joint swelling.  Skin: Negative for color change and wound.  Neurological: Negative for weakness and numbness.      Allergies  Review of patient's allergies indicates no known allergies.  Home Medications   Prior to Admission medications   Medication Sig Start Date End Date Taking? Authorizing Provider  ibuprofen  (ADVIL,MOTRIN) 800 MG tablet Take 1 tablet (800 mg total) by mouth 3 (three) times daily. 01/21/15   Burgess Amor, PA-C  sulfamethoxazole-trimethoprim (BACTRIM DS,SEPTRA DS) 800-160 MG tablet Take 1 tablet by mouth 2 (two) times daily. 01/21/15 01/28/15  Raynelle Fanning Texie Tupou, PA-C   BP 130/80 mmHg  Pulse 72  Temp(Src) 98.5 F (36.9 C) (Oral)  Resp 18  Ht  (1.651 m)  Wt 70.308 kg  BMI 25.79 kg/m2  SpO2 98% Physical Exam  Constitutional: He appears well-developed and well-nourished.  HENT:  Head: Atraumatic.  Neck: Normal range of motion.  Cardiovascular:  Pulses:      Radial pulses are 2+ on the right side, and 2+ on the left side.  Pulses equal bilaterally  Musculoskeletal: He exhibits edema and tenderness.       Right hand: He exhibits swelling. He exhibits no bony tenderness, normal capillary refill and no deformity. Normal sensation noted. Normal strength noted.  Moderate edema of right dorsal hand without extension into the fingers, no involvement in wrist or forearm.  No increased warmth but there is a mild increased redness at the site of edema.  Skin is intact without punctures or abrasion including finger web spaces and cuticles. No red streaking. No induration or localizing fluctuance.   Neurological: He is alert. He has normal strength. He displays normal reflexes. No sensory deficit.  Skin: Skin is warm and dry.  Psychiatric: He has a normal mood and affect.  ED Course  Procedures (including critical care time) Labs Review Labs Reviewed  BASIC METABOLIC PANEL - Abnormal; Notable for the following:    Glucose, Bld 142 (*)    All other components within normal limits  CBC WITH DIFFERENTIAL/PLATELET    Imaging Review Dg Hand Complete Right  01/21/2015  CLINICAL DATA:  Hand pain and swelling, initial encounter, no known injury EXAM: RIGHT HAND - COMPLETE 3+ VIEW COMPARISON:  None. FINDINGS: There is deformity in the distal aspect of the fifth metacarpal consistent with  prior fracture with healing. Generalized soft tissue swelling is noted in the proximal hand. No significant swelling and digits is noted. No acute fracture or dislocation is noted. IMPRESSION: Soft tissue swelling without acute bony abnormality. Electronically Signed   By: Alcide CleverMark  Lukens M.D.   On: 01/21/2015 19:55   I have personally reviewed and evaluated these images and lab results as part of my medical decision-making.   EKG Interpretation None      MDM   Final diagnoses:  Swelling of right hand    Pt with localized right dorsal hand swelling, mild soreness with insignificant pain improved with ibuprofen here.  There is a mild degree of non streaking erythema which could be localized reaction to inflammation but will cover for possible cellulitis, although no nidus for this per exam today.  He was placed on bactrim, first doses given here.  Ibuprofen, advised elevation, brief applications of ice for inflammation reduction.  Discussed return here for a recheck tomorrow for any worsened sx including swelling, pain or extension into forearm.  No hx or fam hx of gout and there is no specific joint involvement, doubt gout. Labs normal, discussed elevated cbg.  Pt has been drinking a soda while here.    The patient appears reasonably screened and/or stabilized for discharge and I doubt any other medical condition or other [facility] requiring further screening, evaluation, or treatment in the ED at this time prior to discharge.     Burgess AmorJulie Socrates Cahoon, PA-C 01/22/15 0122  Marily MemosJason Mesner, MD 01/22/15 [phone]

## 2015-08-29 ENCOUNTER — Encounter (HOSPITAL_COMMUNITY): Payer: Self-pay | Admitting: Emergency Medicine

## 2015-08-29 ENCOUNTER — Emergency Department (HOSPITAL_COMMUNITY)
Admission: EM | Admit: 2015-08-29 | Discharge: 2015-08-29 | Disposition: A | Discharge status: Home / Self Care | Payer: No Typology Code available for payment source | Attending: Emergency Medicine | Admitting: Emergency Medicine

## 2015-08-29 DIAGNOSIS — F17210 Nicotine dependence, cigarettes, uncomplicated: Secondary | ICD-10-CM | POA: Insufficient documentation

## 2015-08-29 DIAGNOSIS — M79643 Pain in unspecified hand: Secondary | ICD-10-CM | POA: Insufficient documentation

## 2015-08-29 MED ORDER — IBUPROFEN 800 MG PO TABS: 800.0000 mg | ORAL_TABLET | Freq: Three times a day (TID) | ORAL | Status: DC

## 2015-08-29 NOTE — Discharge Instructions (Signed)
Please use ibuprofen and Tylenol as needed for discomfort, please monitor for worsening signs or symptoms, return to emergency room immediately if any present. Please follow-up with neurology if you continue to experience tingling sensation, follow-up in the emergency room if it worsens.  Joint Pain Joint pain, which is also called arthralgia, can be caused by many things. Joint pain often goes away when you follow your health care provider's instructions for relieving pain at home. However, joint pain can also be caused by conditions that require further treatment. Common causes of joint pain include:  Bruising in the area of the joint.  Overuse of the joint.  Wear and tear on the joints that occur with aging (osteoarthritis).  Various other forms of arthritis.  A buildup of a crystal form of uric acid in the joint (gout).  Infections of the joint (septic arthritis) or of the bone (osteomyelitis). Your health care provider may recommend medicine to help with the pain. If your joint pain continues, additional tests may be needed to diagnose your condition. HOME CARE INSTRUCTIONS Watch your condition for any changes. Follow these instructions as directed to lessen the pain that you are feeling.  Take medicines only as directed by your health care provider.  Rest the affected area for as long as your health care provider says that you should. If directed to do so, raise the painful joint above the level of your heart while you are sitting or lying down.  Do not do things that cause or worsen pain.  If directed, apply ice to the painful area:  Put ice in a plastic bag.  Place a towel between your skin and the bag.  Leave the ice on for 20 minutes, 2-3 times per day.  Wear an elastic bandage, splint, or sling as directed by your health care provider. Loosen the elastic bandage or splint if your fingers or toes become numb and tingle, or if they turn cold and blue.  Begin exercising or  stretching the affected area as directed by your health care provider. Ask your health care provider what types of exercise are safe for you.  Keep all follow-up visits as directed by your health care provider. This is important. SEEK MEDICAL CARE IF:  Your pain increases, and medicine does not help.  Your joint pain does not improve within 3 days.  You have increased bruising or swelling.  You have a fever.  You lose 10 lb (4.5 kg) or more without trying. SEEK IMMEDIATE MEDICAL CARE IF:  You are not able to move the joint.  Your fingers or toes become numb or they turn cold and blue.   This information is not intended to replace advice given to you by your health care provider. Make sure you discuss any questions you have with your health care provider.   Document Released: 01/28/2005 Document Revised: 02/18/2014 Document Reviewed: 11/09/2013 Elsevier Interactive Patient Education [employer]2016 Elsevier Inc.

## 2015-08-29 NOTE — ED Provider Notes (Signed)
CSN: [id number]     Arrival date & time 08/29/15  2158 History   First MD Initiated Contact with Patient 08/29/15 2202     Chief Complaint  Patient presents with  . Hand Pain   HPI   40 year old male presents today with complaints of bilateral hand pain. Patient reports symptoms started after waking this morning with tingling in the distal aspects of his hands, worse over the fifth digit. He reports symptoms are coming and going, with the addition of pain vaguely throughout his hand. He reports that his "veins are swelling", but notes that they go back down to normal size. Patient denies any headache, neck pain, shoulder or elbow pain. He reports that he works in [employer] but has not worked for the last 2 weeks. He denies any pain with range of motion of the wrists elbows shoulders. He denies any fever or chills, nausea or vomiting. He denies any rash or edema to the hands. Patient denies any preceding neurological deficits.    History reviewed. No pertinent past medical history. Past Surgical History  Procedure Laterality Date  . Shoulder surgery     History reviewed. No pertinent family history. Social History  Substance Use Topics  . Smoking status: Current Every Day Smoker -- 1.00 packs/day    Types: Cigarettes  . Smokeless tobacco: None  . Alcohol Use: Yes     Comment: occasionally    Review of Systems  All other systems reviewed and are negative.   Allergies  Review of patient's allergies indicates no known allergies.  Home Medications   Prior to Admission medications   Medication Sig Start Date End Date Taking? Authorizing Provider  ibuprofen (ADVIL,MOTRIN) 800 MG tablet Take 1 tablet (800 mg total) by mouth 3 (three) times daily. 08/29/15   Aoki Wedemeyer, PA-C   BP 140/85 mmHg  Pulse 69  Temp(Src) 98.6 F (37 C) (Temporal)  Resp 18  Ht 5\' 5"  (1.651 m)  Wt 63.504 kg  BMI 23.30 kg/m2  SpO2 93%   Physical Exam  Constitutional: He is oriented to person,  place, and time. He appears well-developed and well-nourished.  HENT:  Head: Normocephalic and atraumatic.  Eyes: Conjunctivae are normal. Pupils are equal, round, and reactive to light. Right eye exhibits no discharge. Left eye exhibits no discharge. No scleral icterus.  Neck: Normal range of motion. No JVD present. No tracheal deviation present.  Pulmonary/Chest: Effort normal. No stridor.  Musculoskeletal:  External inspection of the hands wrist and upper extremity shows no swelling, edema, redness, warmth to touch. Full active range of motion of the hands wrist elbow shoulder neck and back. No significant tenderness to palpation of the upper extremity, no pain over compression of the medial compartment, negative Tinel's. No signs of vasculitis, cellulitis, or any infectious etiology. Neck is supple with full active range of motion, no pain with axial loading of the spine. No CT or L-spine tenderness, no signs of infectious etiology of the back.  Neurological: He is alert and oriented to person, place, and time. Coordination normal.  Psychiatric: He has a normal mood and affect. His behavior is normal. Judgment and thought content normal.  Nursing note and vitals reviewed.   ED Course  Procedures (including critical care time) Labs Review Labs Reviewed - No data to display  Imaging Review No results found. I have personally reviewed and evaluated these images and lab results as part of my medical decision-making.   EKG Interpretation None      MDM  Final diagnoses:  Pain of hand, unspecified laterality    Labs:   Imaging:  Consults:  Therapeutics:  Discharge Meds:   Assessment/Plan: 40 year old male presents today with complaints of hand pain. Uncertain etiology of his pain today, has no pain or infectious etiology of his neck or spine, risk factors for malignancy, no signs of infectious etiology, no consistent findings with compartment syndrome, Carpal tunnel, or  irritation of the ulnar nerve. Patient will be instructed to use ibuprofen, rest, monitor for any new or worsening signs or symptoms. Patient does not have a primary care provider, he is encouraged to follow up in the ED if symptoms worsen or do not improve.   Patient understood and agreed to today's plan, he verbalized his understanding and had no further questions or concerns at time of discharge. Strict return percussion's were given.        Eyvonne Mechanic, PA-C 08/30/15 0004  Eber Hong, MD 08/30/15 [phone]

## 2015-08-29 NOTE — ED Notes (Signed)
Pt states that the veins in both hands have been hurting today.

## 2016-06-12 ENCOUNTER — Encounter (HOSPITAL_COMMUNITY): Payer: Self-pay

## 2016-06-12 ENCOUNTER — Emergency Department (HOSPITAL_COMMUNITY)
Admission: EM | Admit: 2016-06-12 | Discharge: 2016-06-12 | Disposition: A | Discharge status: Home / Self Care | Payer: Self-pay | Attending: Emergency Medicine | Admitting: Emergency Medicine

## 2016-06-12 DIAGNOSIS — F1290 Cannabis use, unspecified, uncomplicated: Secondary | ICD-10-CM | POA: Insufficient documentation

## 2016-06-12 DIAGNOSIS — F17210 Nicotine dependence, cigarettes, uncomplicated: Secondary | ICD-10-CM | POA: Insufficient documentation

## 2016-06-12 DIAGNOSIS — L308 Other specified dermatitis: Secondary | ICD-10-CM

## 2016-06-12 DIAGNOSIS — Z79899 Other long term (current) drug therapy: Secondary | ICD-10-CM | POA: Insufficient documentation

## 2016-06-12 DIAGNOSIS — L259 Unspecified contact dermatitis, unspecified cause: Secondary | ICD-10-CM | POA: Insufficient documentation

## 2016-06-12 MED ORDER — HYDROXYZINE HCL 25 MG PO TABS
25.0000 mg | ORAL_TABLET | Freq: Once | ORAL | Status: AC
Start: 2016-06-12 — End: 2016-06-12
  Administered 2016-06-12: 25 mg via ORAL
  Filled 2016-06-12: qty 1

## 2016-06-12 MED ORDER — DEXAMETHASONE SODIUM PHOSPHATE 4 MG/ML IJ SOLN
8.0000 mg | Freq: Once | INTRAMUSCULAR | Status: AC
  Administered 2016-06-12: 8 mg via INTRAMUSCULAR
  Filled 2016-06-12: qty 2

## 2016-06-12 MED ORDER — TRIAMCINOLONE ACETONIDE 0.1 % EX CREA: 1.0000 "application " | TOPICAL_CREAM | Freq: Two times a day (BID) | CUTANEOUS | 0 refills | Status: DC

## 2016-06-12 MED ORDER — DEXAMETHASONE 4 MG PO TABS: 4.0000 mg | ORAL_TABLET | Freq: Two times a day (BID) | ORAL | 0 refills | Status: DC

## 2016-06-12 NOTE — Discharge Instructions (Signed)
Your examination suggest possible contact dermatitis, as well as some findings of eczema. Please use the triamcinolone 2 times daily until the rash resolves. Please use Decadron 2 times daily with food. Use Claritin or Allegra each morning to alleviate itching. May use Benadryl at bedtime for itching. Please see Dr. Margo Aye, or the dermatologist of your choice if this is not improving.

## 2016-06-12 NOTE — ED Provider Notes (Signed)
[facility] Provider Note   CSN: [id number] Arrival date & time: 06/12/16  6962     History   Chief Complaint Chief Complaint  Patient presents with  . Rash    HPI Christian Ali is a 41 y.o. male.   Rash   This is a new problem. The current episode started more than 1 week ago. The problem has been gradually worsening. The problem is associated with chemical exposure. There has been no fever. The rash is present on the right arm and left arm. The pain is moderate. The pain has been constant since onset. Associated symptoms include itching. He has tried antibiotic cream for the symptoms.    History reviewed. No pertinent past medical history.  There are no active problems to display for this patient.   Past Surgical History:  Procedure Laterality Date  . SHOULDER SURGERY         Home Medications    Prior to Admission medications   Medication Sig Start Date End Date Taking? Authorizing Provider  ibuprofen (ADVIL,MOTRIN) 800 MG tablet Take 1 tablet (800 mg total) by mouth 3 (three) times daily. 08/29/15  Yes Jeffrey Hedges, PA-C  dexamethasone (DECADRON) 4 MG tablet Take 1 tablet (4 mg total) by mouth 2 (two) times daily with a meal. 06/12/16   Ivery Quale, PA-C  triamcinolone cream (KENALOG) 0.1 % Apply 1 application topically 2 (two) times daily. 06/12/16   Ivery Quale, PA-C    Family History No family history on file.  Social History Social History  Substance Use Topics  . Smoking status: Current Every Day Smoker    Packs/day: 1.00    Types: Cigarettes  . Smokeless tobacco: Never Used  . Alcohol use Yes     Comment: occasionally     Allergies   Patient has no known allergies.   Review of Systems Review of Systems  Constitutional: Negative for activity change.       All ROS Neg except as noted in HPI  HENT: Negative for nosebleeds.   Eyes: Negative for photophobia and discharge.  Respiratory: Negative for cough, shortness of breath and  wheezing.   Cardiovascular: Negative for chest pain and palpitations.  Gastrointestinal: Negative for abdominal pain and blood in stool.  Genitourinary: Negative for dysuria, frequency and hematuria.  Musculoskeletal: Negative for arthralgias, back pain and neck pain.  Skin: Positive for itching and rash.  Neurological: Negative for dizziness, seizures and speech difficulty.  Psychiatric/Behavioral: Negative for confusion and hallucinations.     Physical Exam Updated Vital Signs BP 106/84 (BP Location: Left Arm)   Pulse 64   Temp 98.2 F (36.8 C) (Oral)   Resp 18   Ht  (1.676 m)   Wt 65.8 kg   SpO2 99%   BMI 23.40 kg/m   Physical Exam  Constitutional: He is oriented to person, place, and time. He appears well-developed and well-nourished.  Non-toxic appearance.  HENT:  Head: Normocephalic.  Right Ear: Tympanic membrane and external ear normal.  Left Ear: Tympanic membrane and external ear normal.  Eyes: EOM and lids are normal. Pupils are equal, round, and reactive to light.  Neck: Normal range of motion. Neck supple. Carotid bruit is not present.  Cardiovascular: Normal rate, regular rhythm, normal heart sounds, intact distal pulses and normal pulses.   Pulmonary/Chest: Breath sounds normal. No respiratory distress.  Abdominal: Soft. Bowel sounds are normal. There is no tenderness. There is no guarding.  Musculoskeletal: Normal range of motion.  Lymphadenopathy:  Head (right side): No submandibular adenopathy present.       Head (left side): No submandibular adenopathy present.    He has no cervical adenopathy.  Neurological: He is alert and oriented to person, place, and time. He has normal strength. No cranial nerve deficit or sensory deficit.  Skin: Skin is warm and dry. Rash noted.  There are raised splotching areas of the upper extremity with dry skin present. These areas are darker than the other pigmented noted of the upper extremities. There no red streaks  appreciated. Is no drainage appreciated.  Psychiatric: He has a normal mood and affect. His speech is normal.  Nursing note and vitals reviewed.    ED Treatments / Results  Labs (all labs ordered are listed, but only abnormal results are displayed) Labs Reviewed - No data to display  EKG  EKG Interpretation None       Radiology No results found.  Procedures Procedures (including critical care time)  Medications Ordered in ED Medications  dexamethasone (DECADRON) injection 8 mg (not administered)  hydrOXYzine (ATARAX/VISTARIL) tablet 25 mg (not administered)     Initial Impression / Assessment and Plan / ED Course  I have reviewed the triage vital signs and the nursing notes.  Pertinent labs & imaging results that were available during my care of the patient were reviewed by me and considered in my medical decision making (see chart for details).     **I have reviewed nursing notes, vital signs, and all appropriate lab and imaging results for this patient.*  Final Clinical Impressions(s) / ED Diagnoses MDM Vital signs within normal limits. The patient has a rash of both upper extremities that does not go past the elbow. The patient feels that this is a contact dermatitis, as there are other members of his work staff that also have similar rash and they worked with chemicals at her job. The rash also has a few components that could be related to eczema. The patient will be treated with steroids, antihistamines, and triamcinolone. The patient is referred to dermatology for formal evaluation of this rash. Patient is in agreement with this plan.    Final diagnoses:  Contact dermatitis, unspecified contact dermatitis type, unspecified trigger  Other eczema    New Prescriptions New Prescriptions   DEXAMETHASONE (DECADRON) 4 MG TABLET    Take 1 tablet (4 mg total) by mouth 2 (two) times daily with a meal.   TRIAMCINOLONE CREAM (KENALOG) 0.1 %    Apply 1 application  topically 2 (two) times daily.     Ivery Quale, PA-C 06/13/16 [name]    Donnetta Hutching, MD 06/15/16 [phone]

## 2016-06-12 NOTE — ED Triage Notes (Signed)
Pt reports rash to forearms x 2 weeks.  Pt thinks is coming from something at work.

## 2016-08-04 ENCOUNTER — Emergency Department (HOSPITAL_COMMUNITY)
Admission: EM | Admit: 2016-08-04 | Discharge: 2016-08-04 | Disposition: A | Discharge status: Home / Self Care | Payer: Self-pay | Attending: Emergency Medicine | Admitting: Emergency Medicine

## 2016-08-04 ENCOUNTER — Encounter (HOSPITAL_COMMUNITY): Payer: Self-pay

## 2016-08-04 DIAGNOSIS — F17210 Nicotine dependence, cigarettes, uncomplicated: Secondary | ICD-10-CM | POA: Insufficient documentation

## 2016-08-04 DIAGNOSIS — L241 Irritant contact dermatitis due to oils and greases: Secondary | ICD-10-CM | POA: Insufficient documentation

## 2016-08-04 MED ORDER — PREDNISONE 10 MG PO TABS: ORAL_TABLET | ORAL | 0 refills | Status: DC

## 2016-08-04 MED ORDER — TRIAMCINOLONE ACETONIDE 0.1 % EX CREA: 1.0000 "application " | TOPICAL_CREAM | Freq: Three times a day (TID) | CUTANEOUS | 0 refills | Status: DC

## 2016-08-04 NOTE — Discharge Instructions (Signed)
Call the dermatologist listed to arrange a follow-up appt

## 2016-08-04 NOTE — ED Triage Notes (Signed)
Pt reports rash x 1 month.  Reports was seen here previously and was given triamcinolone cream and "a pill."  Pt says was getting better and ran out of his medication.

## 2016-08-04 NOTE — ED Provider Notes (Signed)
[facility] Provider Note   CSN: [id number] Arrival date & time: 08/04/16  0735     History   Chief Complaint Chief Complaint  Patient presents with  . Rash    HPI Kysean Bonna GainsM Hancher is a 41 y.o. male.  HPI   Ivo Bonna GainsM Krakow is a 41 y.o. male who presents to the Emergency Department complaining of persistent, recurring rash to his arms, neck and lower abdomen.  Rash has been waxing and waning for one month.  Seen here last month for same.  States he was given a pill and prescription for a cream that seem to help, but rash returned after he finished the medication.  Describes the rash as pruritic and burning.  States that he works [employer] and thinks his rash is related to chemical exposure. Denies fever, chills, fatigue, swelling.  History reviewed. No pertinent past medical history.  There are no active problems to display for this patient.   Past Surgical History:  Procedure Laterality Date  . SHOULDER SURGERY         Home Medications    Prior to Admission medications   Medication Sig Start Date End Date Taking? Authorizing Provider  dexamethasone (DECADRON) 4 MG tablet Take 1 tablet (4 mg total) by mouth 2 (two) times daily with a meal. 06/12/16   Ivery QualeBryant, Hobson, PA-C  ibuprofen (ADVIL,MOTRIN) 800 MG tablet Take 1 tablet (800 mg total) by mouth 3 (three) times daily. 08/29/15   Hedges, Tinnie GensJeffrey, PA-C  triamcinolone cream (KENALOG) 0.1 % Apply 1 application topically 2 (two) times daily. 06/12/16   Ivery QualeBryant, Hobson, PA-C    Family History No family history on file.  Social History Social History  Substance Use Topics  . Smoking status: Current Every Day Smoker    Packs/day: 1.00    Types: Cigarettes  . Smokeless tobacco: Never Used  . Alcohol use Yes     Comment: occasionally     Allergies   Patient has no known allergies.   Review of Systems Review of Systems  Constitutional: Negative for activity change, appetite change, chills and fever.   HENT: Negative for facial swelling, sore throat and trouble swallowing.   Respiratory: Negative for chest tightness, shortness of breath and wheezing.   Musculoskeletal: Negative for neck pain and neck stiffness.  Skin: Positive for rash. Negative for wound.  Neurological: Negative for dizziness, weakness, numbness and headaches.  All other systems reviewed and are negative.    Physical Exam Updated Vital Signs BP (!) 146/93 (BP Location: Left Arm)   Pulse 79   Temp 97.9 F (36.6 C) (Oral)   Resp 20   Ht 5\' 6"  (1.676 m)   Wt 63.5 kg (140 lb)   SpO2 98%   BMI 22.60 kg/m   Physical Exam  Constitutional: He is oriented to person, place, and time. He appears well-developed and well-nourished. No distress.  HENT:  Head: Normocephalic and atraumatic.  Mouth/Throat: Oropharynx is clear and moist.  Neck: Normal range of motion. Neck supple.  Cardiovascular: Normal rate, regular rhythm, normal heart sounds and intact distal pulses.   No murmur heard. Pulmonary/Chest: Effort normal and breath sounds normal. No respiratory distress.  Musculoskeletal: Normal range of motion. He exhibits no edema or tenderness.  Lymphadenopathy:    He has no cervical adenopathy.  Neurological: He is alert and oriented to person, place, and time. He exhibits normal muscle tone. Coordination normal.  Skin: Skin is warm. Capillary refill takes less than 2 seconds. Rash noted.  There is erythema.  Scattered erythematous, crusty appearing plaques to the bilateral upper extremities, right neck, lower abdomen.  No vesicles, pustules.    Nursing note and vitals reviewed.    ED Treatments / Results  Labs (all labs ordered are listed, but only abnormal results are displayed) Labs Reviewed - No data to display  EKG  EKG Interpretation None       Radiology No results found.  Procedures Procedures (including critical care time)  Medications Ordered in ED Medications - No data to  display   Initial Impression / Assessment and Plan / ED Course  I have reviewed the triage vital signs and the nursing notes.  Pertinent labs & imaging results that were available during my care of the patient were reviewed by me and considered in my medical decision making (see chart for details).    Patient is well-appearing. Vital signs are stable. Rash appears consistent with contact dermatitis. Patient reports improvement with short course of oral steroids and triamcinolone cream. He appears stable for discharge, I will prescribe prednisone taper pack and refill his triamcinolone cream. Referral information given for dermatology. I have explained to the patient that his symptoms are likely related to chemical exposure and repeated exposure will more than likely cause the rash to return.  Final Clinical Impressions(s) / ED Diagnoses   Final diagnoses:  Irritant contact dermatitis due to oils    New Prescriptions New Prescriptions   No medications on file     Pauline Aus, Cordelia Poche 08/04/16 2130    Donnetta Hutching, MD 08/05/16 1259

## 2016-10-06 ENCOUNTER — Emergency Department (HOSPITAL_COMMUNITY): Payer: Self-pay

## 2016-10-06 ENCOUNTER — Emergency Department (HOSPITAL_COMMUNITY)
Admission: EM | Admit: 2016-10-06 | Discharge: 2016-10-06 | Disposition: A | Discharge status: Home / Self Care | Payer: Self-pay | Attending: Emergency Medicine | Admitting: Emergency Medicine

## 2016-10-06 ENCOUNTER — Encounter (HOSPITAL_COMMUNITY): Payer: Self-pay

## 2016-10-06 DIAGNOSIS — Z79899 Other long term (current) drug therapy: Secondary | ICD-10-CM | POA: Insufficient documentation

## 2016-10-06 DIAGNOSIS — L03114 Cellulitis of left upper limb: Secondary | ICD-10-CM | POA: Insufficient documentation

## 2016-10-06 DIAGNOSIS — F17210 Nicotine dependence, cigarettes, uncomplicated: Secondary | ICD-10-CM | POA: Insufficient documentation

## 2016-10-06 DIAGNOSIS — R030 Elevated blood-pressure reading, without diagnosis of hypertension: Secondary | ICD-10-CM | POA: Insufficient documentation

## 2016-10-06 DIAGNOSIS — L259 Unspecified contact dermatitis, unspecified cause: Secondary | ICD-10-CM | POA: Insufficient documentation

## 2016-10-06 MED ORDER — HYDROCODONE-ACETAMINOPHEN 5-325 MG PO TABS: 1.0000 | ORAL_TABLET | Freq: Four times a day (QID) | ORAL | 0 refills | Status: DC | PRN

## 2016-10-06 MED ORDER — MORPHINE SULFATE (PF) 4 MG/ML IV SOLN
4.0000 mg | Freq: Once | INTRAVENOUS | Status: AC
  Administered 2016-10-06: 4 mg via INTRAVENOUS
  Filled 2016-10-06: qty 1

## 2016-10-06 MED ORDER — IOPAMIDOL (ISOVUE-300) INJECTION 61%
100.0000 mL | Freq: Once | INTRAVENOUS | Status: AC | PRN
  Administered 2016-10-06: 100 mL via INTRAVENOUS

## 2016-10-06 MED ORDER — CEPHALEXIN 500 MG PO CAPS: 500.0000 mg | ORAL_CAPSULE | Freq: Three times a day (TID) | ORAL | 0 refills | Status: DC

## 2016-10-06 MED ORDER — TRIAMCINOLONE ACETONIDE 0.1 % EX CREA: 1.0000 "application " | TOPICAL_CREAM | Freq: Two times a day (BID) | CUTANEOUS | 0 refills | Status: DC

## 2016-10-06 NOTE — Discharge Instructions (Signed)
Take Tylenol for mild pain or the pain medicine prescribed for bad pain. Don't take Tylenol together with the pain medicine prescribed as the combination can be dangerous to your liver. Get your left arm rechecked in the next 2 or 3 days at an urgent care center. Use the cream prescribed for the rash on your hands. Call Dr. Terri Piedra to schedule an office visit. Your blood pressure is elevated today at 145/95 and should be rechecked within the next 3 weeks. You can call any of the numbers on these instructions to get a primary care physician to fall you for blood pressure. Return if you develop fever, vomiting or feel worse for any reason

## 2016-10-06 NOTE — ED Provider Notes (Signed)
[facility] Provider Note   CSN: [id number] Arrival date & time: 10/06/16  1814     History   Chief Complaint Chief Complaint  Patient presents with  . Abscess  . Elbow Pain    HPI Christian Ali is a 41 y.o. male.complains of pain and swelling at left proximal forearm onset yesterday, progressively worsening. Denies pain at elbow. He reports that his girlfriend squeezed a tiny amount of pus from the area. Nothing makes symptoms better or worse. No fever no nausea or vomiting patient also reports a rash that is somewhat painful with swelling on his fingers for the past 4 months. He's been seen here for the same rash in Jun 12, 2016 diagnosed with contact dermatitis treated with triamcinolone and steroids referred to dermatologist however he did not follow up no other associated symptoms  HPI  History reviewed. No pertinent past medical history.  There are no active problems to display for this patient.   Past Surgical History:  Procedure Laterality Date  . SHOULDER SURGERY         Home Medications    Prior to Admission medications   Medication Sig Start Date End Date Taking? Authorizing Provider  dexamethasone (DECADRON) 4 MG tablet Take 1 tablet (4 mg total) by mouth 2 (two) times daily with a meal. 06/12/16   Ivery Quale, PA-C  ibuprofen (ADVIL,MOTRIN) 800 MG tablet Take 1 tablet (800 mg total) by mouth 3 (three) times daily. 08/29/15   Hedges, Tinnie Gens, PA-C  predniSONE (DELTASONE) 10 MG tablet Take 6 tablets day one, 5 tablets day two, 4 tablets day three, 3 tablets day four, 2 tablets day five, then 1 tablet day six 08/04/16   Triplett, Tammy, PA-C  triamcinolone cream (KENALOG) 0.1 % Apply 1 application topically 3 (three) times daily. 08/04/16   Pauline Aus, PA-C    Family History No family history on file.  Social History Social History  Substance Use Topics  . Smoking status: Current Every Day Smoker    Packs/day: 1.00    Types: Cigarettes  .  Smokeless tobacco: Never Used  . Alcohol use Yes     Comment: occasionally    Positive marijuana use no IV drug use Allergies   Patient has no known allergies.   Review of Systems Review of Systems  Skin: Positive for rash and wound.       Swollen, reddened area at left forearm  Allergic/Immunologic:       Current on tetanus immunization  All other systems reviewed and are negative.    Physical Exam Updated Vital Signs BP (!) 142/87 (BP Location: Right Arm)   Pulse 83   Temp 99.6 F (37.6 C) (Oral)   Resp 20   Ht 5\' 5"  (1.651 m)   Wt 64.4 kg (142 lb)   SpO2 97%   BMI 23.63 kg/m   Physical Exam  Constitutional: He appears well-developed and well-nourished.  HENT:  Head: Normocephalic and atraumatic.  Eyes: Pupils are equal, round, and reactive to light. Conjunctivae are normal.  Neck: Neck supple. No tracheal deviation present. No thyromegaly present.  Cardiovascular: Normal rate and regular rhythm.   No murmur heard. Pulmonary/Chest: Effort normal and breath sounds normal.  Abdominal: Soft. Bowel sounds are normal. He exhibits no distension. There is no tenderness.  Musculoskeletal: Normal range of motion. He exhibits no edema or tenderness.  Left upper extremity baseball size swollen reddened area which is mildly tender at the ulnar aspect of the proximal forearm. Elbow has  full range of motion and is nontender. No axillary nodes. No red streaks proximally.  Neurological: He is alert. Coordination normal.  Skin: Skin is warm and dry. Capillary refill takes less than 2 seconds. Rash noted.  Chronic appearing heaped up hyperpigmented skin on dorsal aspects of several fingers of bilateral hands. Good capillary refill.   Psychiatric: He has a normal mood and affect.  Nursing note and vitals reviewed.    ED Treatments / Results  Labs (all labs ordered are listed, but only abnormal results are displayed) Labs Reviewed - No data to display  EKG  EKG  Interpretation None       Radiology No results found.  Procedures Procedures (including critical care time)  Medications Ordered in ED Medications  morphine 4 MG/ML injection 4 mg (not administered)    Results for orders placed or performed during the hospital encounter of 01/21/15  CBC with Differential  Result Value Ref Range   WBC 5.5 4.0 - 10.5 K/uL   RBC 4.73 4.22 - 5.81 MIL/uL   Hemoglobin 15.7 13.0 - 17.0 g/dL   HCT 40.9 81.1 - 91.4 %   MCV 96.0 78.0 - 100.0 fL   MCH 33.2 26.0 - 34.0 pg   MCHC 34.6 30.0 - 36.0 g/dL   RDW 78.2 95.6 - 21.3 %   Platelets 240 150 - 400 K/uL   Neutrophils Relative % 37 %   Neutro Abs 2.0 1.7 - 7.7 K/uL   Lymphocytes Relative 52 %   Lymphs Abs 2.9 0.7 - 4.0 K/uL   Monocytes Relative 8 %   Monocytes Absolute 0.4 0.1 - 1.0 K/uL   Eosinophils Relative 3 %   Eosinophils Absolute 0.2 0.0 - 0.7 K/uL   Basophils Relative 0 %   Basophils Absolute 0.0 0.0 - 0.1 K/uL  Basic metabolic panel  Result Value Ref Range   Sodium 139 135 - 145 mmol/L   Potassium 3.6 3.5 - 5.1 mmol/L   Chloride 105 101 - 111 mmol/L   CO2 25 22 - 32 mmol/L   Glucose, Bld 142 (H) 65 - 99 mg/dL   BUN 9 6 - 20 mg/dL   Creatinine, Ser 0.86 0.61 - 1.24 mg/dL   Calcium 9.1 8.9 - 57.8 mg/dL   GFR calc non Af Amer >60 >60 mL/min   GFR calc Af Amer >60 >60 mL/min   Anion gap 9 5 - 15   Ct Forearm Left W Contrast  Result Date: 10/06/2016 CLINICAL DATA:  Left forearm swelling, abscess.  Redness and warmth. EXAM: CT OF THE UPPER LEFT EXTREMITY WITH CONTRAST TECHNIQUE: Multidetector CT imaging of the upper left extremity was performed according to the standard protocol following intravenous contrast administration. Imaging obtained from the elbow through the fingers. COMPARISON:  None. CONTRAST:  100 cc Isovue-300 IV FINDINGS: Bones/Joint/Cartilage No fracture, dislocation or bony destructive change. No periosteal reaction. No focal bone lesion. No elbow joint effusion. No  evidence of wrist joint effusion. Ligaments Suboptimally assessed by CT. Muscles and Tendons No intramuscular fluid collection. Normal fatty muscle striations persist, no CT evidence of muscle edema. Soft tissues Skin thickening and subcutaneous edema about the dorsal proximal forearm and elbow. Skin thickening, some of which is irregular. No peripherally enhancing or drainable fluid collection. Soft tissue edema also noted about the palmar hand, no focal fluid collection. Visualized vessels appear patent. IMPRESSION: 1. Skin thickening and soft tissue edema about the proximal dorsal forearm, consistent with cellulitis in the appropriate clinical setting. No focal fluid  collection or evidence of deep soft tissue infection. No osseous abnormality. 2. Mild soft tissue edema about the palmar hand without focal fluid collection. Electronically Signed   By: Rubye Oaks M.D.   On: 10/06/2016 21:06   Initial Impression / Assessment and Plan / ED Course  I have reviewed the triage vital signs and the nursing notes.  Pertinent labs & imaging results that were available during my care of the patient were reviewed by me and considered in my medical decision making (see chart for details).   9:00 p.m. pain much improved after treatment with intravenous morphine  Clinically patient with cellulitis and CT scan shows no fluid collection or abscess. Plan prescriptions Keflex, Norco. Triamcinolone cream Recheck 3 days. Referral to dermatology Dr.Lupton. And referral to primary care. [place] Controlled Substance reporting System queried. Blood pressure recheck 3 weeks Final Clinical Impressions(s) / ED Diagnoses  Dx #1 cellulitis left forearm #2 contact dermatitis Final diagnoses:  None   #3 elevated blood pressure New Prescriptions New Prescriptions   No medications on file     Doug Sou, MD 10/06/16 2123

## 2016-10-06 NOTE — ED Notes (Signed)
Pt in CT at this time.

## 2016-10-06 NOTE — ED Notes (Signed)
Patient transported to CT 

## 2016-10-06 NOTE — ED Triage Notes (Addendum)
Pt reports that left posterior  elbow began swelling yesterday. Area is warm and red in color arm. Unsure if he has been bitten by a bug  Also has painful swelling in fingers that itch for at least one month and was seen here.

## 2016-10-06 NOTE — ED Notes (Signed)
Patient transported to X-ray 

## 2016-10-27 ENCOUNTER — Encounter (HOSPITAL_COMMUNITY): Payer: Self-pay

## 2016-10-27 ENCOUNTER — Emergency Department (HOSPITAL_COMMUNITY)
Admission: EM | Admit: 2016-10-27 | Discharge: 2016-10-27 | Disposition: A | Discharge status: Home / Self Care | Payer: 59 | Attending: Emergency Medicine | Admitting: Emergency Medicine

## 2016-10-27 DIAGNOSIS — R21 Rash and other nonspecific skin eruption: Secondary | ICD-10-CM | POA: Diagnosis present

## 2016-10-27 DIAGNOSIS — F17210 Nicotine dependence, cigarettes, uncomplicated: Secondary | ICD-10-CM | POA: Insufficient documentation

## 2016-10-27 MED ORDER — TRIAMCINOLONE ACETONIDE 0.1 % EX CREA: 1.0000 "application " | TOPICAL_CREAM | Freq: Two times a day (BID) | CUTANEOUS | 0 refills | Status: DC

## 2016-10-27 MED ORDER — PREDNISONE 20 MG PO TABS: 20.0000 mg | ORAL_TABLET | Freq: Every day | ORAL | 0 refills | Status: AC

## 2016-10-27 NOTE — Discharge Instructions (Signed)
[facility]: ° ° °[facility]  °3.2 (9) · Dermatologist  °[address]  °[phone]  ° °Dr. John H. Hall Jr, MD  °2.6 (18) · Dermatologist  °[address]  °[phone] ° °[facility]  °3.5 (3) · [facility]  °[address]  °[phone]  ° °[facility]  °4.0 (4) · Dermatologist  °[address]  °[phone] ° °Tafeen Stuart MD  °3.0 (2) · Dermatologist  °[address]  °[phone] ° °[facility] Bruce  °2.7 (6) · Dermatologist  °[address]  °[phone] ° °Jordan Amy Y MD  °2.0 (1) · Dermatologist  °[address]  °[phone] ° °[facility]  °5.0 (3) · Doctor  °[address]  °[phone]  ° °

## 2016-10-27 NOTE — ED Triage Notes (Signed)
Reports of rash to hands, legs and torso x3 months. 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 States he has mediation at home but does not have it with him. Unknown name of medication.

## 2016-10-27 NOTE — ED Provider Notes (Signed)
Emergency Department Provider Note   I have reviewed the triage vital signs and the nursing notes.   HISTORY  Chief Complaint Rash   HPI Christian Ali is a 41 y.o. male presents to the emergency department for evaluation of worsening rash to the hands, arms, torso, legs. Rash has been waxing and waning over the past 2 months. 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 He describes it as itchy. He was referred to a dermatologist but lost the contact information and so has not followed up. Denies fevers or chills. He states that he washes the area several times daily but his symptoms do not improve. No modifying factors. No radiation of symptoms.    History reviewed. No pertinent past medical history.  There are no active problems to display for this patient.   Past Surgical History:  Procedure Laterality Date  . SHOULDER SURGERY      Current Outpatient Rx  . Order #: [id number] Class: Print  . Order #: [id number] Class: Print  . Order #: [id number] Class: Print  . Order #: [id number] Class: Print    Allergies Patient has no known allergies.  No family history on file.  Social History Social History  Substance Use Topics  . Smoking status: Current Every Day Smoker    Packs/day: 1.00    Types: Cigarettes  . Smokeless tobacco: Never Used  . Alcohol use Yes     Comment: occasionally    Review of Systems  Constitutional: No fever/chills Eyes: No visual changes. ENT: No sore throat. Cardiovascular: Denies chest pain. Respiratory: Denies shortness of breath. Gastrointestinal: No abdominal pain.  No nausea, no vomiting.  No diarrhea.  No constipation. Genitourinary: Negative for dysuria. Musculoskeletal: Negative for back pain. Skin: Positive  for rash. Neurological: Negative for headaches, focal weakness or numbness.  10-point ROS otherwise negative.  ____________________________________________   PHYSICAL  EXAM:  VITAL SIGNS: ED Triage Vitals  Enc Vitals Group     BP 10/27/16 0916 138/72     Pulse Rate 10/27/16 0916 71     Resp 10/27/16 0916 16     Temp 10/27/16 0916 98.9 F (37.2 C)     Temp Source 10/27/16 0916 Oral     SpO2 10/27/16 0916 97 %     Weight 10/27/16 0916 142 lb (64.4 kg)     Height 10/27/16 0916  (1.676 m)     Pain Score 10/27/16 0914 0   Constitutional: Alert and oriented. Well appearing and in no acute distress. Eyes: Conjunctivae are normal.  Head: Atraumatic. Nose: No congestion/rhinnorhea. Mouth/Throat: Mucous membranes are moist.   Neck: No stridor.   Cardiovascular: Normal rate, regular rhythm. Good peripheral circulation. Grossly normal heart sounds.   Respiratory: Normal respiratory effort.  No retractions. Lungs CTAB. Gastrointestinal: Soft and nontender. No distention.  Musculoskeletal: No lower extremity tenderness nor edema. No gross deformities of extremities. Neurologic:  Normal speech and language. No gross focal neurologic deficits are appreciated.  Skin:  Skin is warm, dry and intact. Thickened, scaly plaques over fingers, forearms, and legs. Some more diffuse rash over the abdomen. No petechial component.   ____________________________________________   PROCEDURES  Procedure(s) performed:   Procedures  None ____________________________________________   INITIAL IMPRESSION / ASSESSMENT AND PLAN / ED COURSE  Pertinent labs & imaging results that were available during my care of the patient were reviewed by me and considered in my medical decision making (see chart for details).  Patient resents emergency department for  evaluation of rash over the hands, torso, legs. Symptoms have been progressively worsening over the past 2 months. Other individuals at his job have similar rash. No sign of infection. No mucosal surface involvement. No fevers or chills. Suspect either contact dermatitis from some work exposure versus psoriasis.  Starting steroid cream and PO steroid burst. Gave patient multiple dermatology follow up options along with PCP referral.   At this time, I do not feel there is any life-threatening condition present. I have reviewed and discussed all results (EKG, imaging, lab, urine as appropriate), exam findings with patient. I have reviewed nursing notes and appropriate previous records.  I feel the patient is safe to be discharged home without further emergent workup. Discussed usual and customary return precautions. Patient and family (if present) verbalize understanding and are comfortable with this plan.  Patient will follow-up with their primary care provider. If they do not have a primary care provider, information for follow-up has been provided to them. All questions have been answered.  ____________________________________________  FINAL CLINICAL IMPRESSION(S) / ED DIAGNOSES  Final diagnoses:  Rash and nonspecific skin eruption     MEDICATIONS GIVEN DURING THIS VISIT:  Medications - No data to display   NEW OUTPATIENT MEDICATIONS STARTED DURING THIS VISIT:  New Prescriptions   PREDNISONE (DELTASONE) 20 MG TABLET    Take 1 tablet (20 mg total) by mouth daily.   TRIAMCINOLONE CREAM (KENALOG) 0.1 %    Apply 1 application topically 2 (two) times daily.    Note:  This document was prepared using Dragon voice recognition software and may include unintentional dictation errors.  Alona Bene, MD Emergency Medicine    Iyla Balzarini, Arlyss Repress, MD 10/27/16 [phone]

## 2016-11-17 ENCOUNTER — Encounter (HOSPITAL_COMMUNITY): Payer: Self-pay

## 2016-11-17 ENCOUNTER — Emergency Department (HOSPITAL_COMMUNITY)
Admission: EM | Admit: 2016-11-17 | Discharge: 2016-11-17 | Disposition: A | Discharge status: Home Health | Payer: 59 | Attending: Emergency Medicine | Admitting: Emergency Medicine

## 2016-11-17 DIAGNOSIS — Z79899 Other long term (current) drug therapy: Secondary | ICD-10-CM | POA: Insufficient documentation

## 2016-11-17 DIAGNOSIS — F17210 Nicotine dependence, cigarettes, uncomplicated: Secondary | ICD-10-CM | POA: Diagnosis not present

## 2016-11-17 DIAGNOSIS — R21 Rash and other nonspecific skin eruption: Secondary | ICD-10-CM | POA: Diagnosis present

## 2016-11-17 DIAGNOSIS — T300 Burn of unspecified body region, unspecified degree: Secondary | ICD-10-CM | POA: Insufficient documentation

## 2016-11-17 MED ORDER — PREDNISONE 10 MG PO TABS: ORAL_TABLET | ORAL | 0 refills | Status: DC

## 2016-11-17 MED ORDER — HYDROCODONE-ACETAMINOPHEN 5-325 MG PO TABS: 1.0000 | ORAL_TABLET | ORAL | 0 refills | Status: DC | PRN

## 2016-11-17 MED ORDER — PREDNISONE 50 MG PO TABS
60.0000 mg | ORAL_TABLET | Freq: Once | ORAL | Status: AC
  Administered 2016-11-17: 60 mg via ORAL
  Filled 2016-11-17: qty 1

## 2016-11-17 MED ORDER — SILVER SULFADIAZINE 1 % EX CREA
TOPICAL_CREAM | Freq: Once | CUTANEOUS | Status: AC
  Administered 2016-11-17: 10:00:00 via TOPICAL
  Filled 2016-11-17: qty 50

## 2016-11-17 NOTE — ED Triage Notes (Signed)
Pt in to be seen for worsening rash to hands, arms, torso and lower extremities. Pt has completed all med given last visit and is scheduled to see dermatologist Thursday, but hands very painful.

## 2016-11-17 NOTE — Discharge Instructions (Signed)
Apply a small amount of the burn cream given to the open areas on your hands and forearms twice daily after a gentle soap and water wash.  Keep these areas covered.  Complete the prednisone taper.  You may take the hydrocodone prescribed for pain relief.  This will make you drowsy - do not drive within 4 hours of taking this medication. You may also take benadryl (no prescription needed) for itch relief.
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
## 2016-12-26 ENCOUNTER — Encounter (HOSPITAL_COMMUNITY): Payer: Self-pay | Admitting: Emergency Medicine

## 2016-12-26 ENCOUNTER — Emergency Department (HOSPITAL_COMMUNITY)
Admission: EM | Admit: 2016-12-26 | Discharge: 2016-12-26 | Disposition: A | Discharge status: Home / Self Care | Payer: 59 | Attending: Emergency Medicine | Admitting: Emergency Medicine

## 2016-12-26 ENCOUNTER — Emergency Department (HOSPITAL_COMMUNITY): Payer: 59

## 2016-12-26 DIAGNOSIS — M2392 Unspecified internal derangement of left knee: Secondary | ICD-10-CM

## 2016-12-26 DIAGNOSIS — Z79899 Other long term (current) drug therapy: Secondary | ICD-10-CM | POA: Diagnosis not present

## 2016-12-26 DIAGNOSIS — M25562 Pain in left knee: Secondary | ICD-10-CM

## 2016-12-26 DIAGNOSIS — F17210 Nicotine dependence, cigarettes, uncomplicated: Secondary | ICD-10-CM | POA: Insufficient documentation

## 2016-12-26 MED ORDER — NAPROXEN 250 MG PO TABS: ORAL_TABLET | ORAL | 0 refills | Status: DC

## 2016-12-26 MED ORDER — IBUPROFEN 400 MG PO TABS
600.0000 mg | ORAL_TABLET | Freq: Once | ORAL | Status: AC
  Administered 2016-12-26: 600 mg via ORAL
  Filled 2016-12-26: qty 2

## 2016-12-26 NOTE — ED Triage Notes (Signed)
Pt bent down at work and felt knee pop when he stood back up, reports swelling to left knee and sharp pain 8/10, has not taken any meds, did not apply ice or heat

## 2016-12-26 NOTE — Discharge Instructions (Signed)
Use ice on your knee for comfort. Wear the knee immobilizer to stabilize your knee. Take the naproxen for pain. Call Dr Mort SawyersHarrison's office to have your knee rechecked, call to get an appointment.
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
## 2017-04-09 ENCOUNTER — Other Ambulatory Visit: Payer: Self-pay

## 2017-04-09 ENCOUNTER — Emergency Department (HOSPITAL_COMMUNITY)
Admission: EM | Admit: 2017-04-09 | Discharge: 2017-04-09 | Disposition: A | Discharge status: Home / Self Care | Payer: 59 | Attending: Emergency Medicine | Admitting: Emergency Medicine

## 2017-04-09 ENCOUNTER — Encounter (HOSPITAL_COMMUNITY): Payer: Self-pay | Admitting: Emergency Medicine

## 2017-04-09 DIAGNOSIS — R21 Rash and other nonspecific skin eruption: Secondary | ICD-10-CM

## 2017-04-09 DIAGNOSIS — F17210 Nicotine dependence, cigarettes, uncomplicated: Secondary | ICD-10-CM | POA: Diagnosis not present

## 2017-04-09 MED ORDER — TETRIX EX CREA: 1.0000 "application " | TOPICAL_CREAM | Freq: Every day | CUTANEOUS | 1 refills | Status: DC

## 2017-04-09 MED ORDER — PREDNISONE 10 MG PO TABS: ORAL_TABLET | ORAL | 0 refills | Status: DC

## 2017-04-09 MED ORDER — PREDNISONE 50 MG PO TABS
60.0000 mg | ORAL_TABLET | Freq: Once | ORAL | Status: AC
  Administered 2017-04-09: 23:00:00 60 mg via ORAL
  Filled 2017-04-09: qty 1

## 2017-04-09 NOTE — Discharge Instructions (Signed)
Use the barrier cream to protect your hands when at work [employer](tetrix).  Complete the course of the prednisone to help clear your current rash and swelling.

## 2017-04-09 NOTE — ED Triage Notes (Signed)
Pt states he needs a cream for his rash to hands and arms. Pt states it has been getting worse over last 2 weeks.

## 2017-04-10 NOTE — ED Provider Notes (Signed)
[facility] EMERGENCY DEPARTMENT Provider Note   CSN: [id number] Arrival date & time: 04/09/17  1825     History   Chief Complaint Chief Complaint  Patient presents with  . Rash    HPI Christian Ali is a 42 y.o. 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  He as seen by Dr. Margo Aye of dermatology last year since topical steroids and other treatments given here were less than effective.  He placed him on a barrier cream called tetrex which completely eliminated the rash.  Unfortunately the rash started flaring again over the past 2 weeks.  He tried getting a refill of this cream but was $800 since his insurance stopped covering it. He plans to contact Dr. Margo Aye but hoped we could give him something now to keep it from getting worse.  He describes scaling and itching on his dorsal fingers currently.  The history is provided by the patient.    History reviewed. No pertinent past medical history.  There are no active problems to display for this patient.   Past Surgical History:  Procedure Laterality Date  . SHOULDER SURGERY         Home Medications    Prior to Admission medications   Medication Sig Start Date End Date Taking? Authorizing Provider  Dermatological Products, Misc. (TETRIX) CREA Apply 1 application topically daily. 04/09/17   Burgess Amor, PA-C  predniSONE (DELTASONE) 10 MG tablet Take 6 tablets day one, 5 tablets day two, 4 tablets day three, 3 tablets day four, 2 tablets day five, then 1 tablet day six 04/09/17   Burgess Amor, PA-C    Family History No family history on file.  Social History Social History   Tobacco Use  . Smoking status: Current Every Day Smoker    Packs/day: 1.00    Types: Cigarettes  . Smokeless tobacco: Never Used  Substance Use Topics  . Alcohol use: Yes    Comment: occasionally  . Drug use: Yes    Types: Marijuana    Comment: occ      Allergies   Patient has no known allergies.   Review of Systems Review of Systems  Constitutional: Negative for chills and fever.  Respiratory: Negative for shortness of breath and wheezing.   Skin: Positive for rash.  Neurological: Negative for numbness.     Physical Exam Updated Vital Signs BP (!) 166/107 (BP Location: Right Arm)   Pulse 62   Temp 98.6 F (37 C) (Oral)   Resp 18   Ht 5\' 6"  (1.676 m)   Wt 65.8 kg (145 lb)   SpO2 100%   BMI 23.40 kg/m   Physical Exam  Constitutional: He appears well-developed and well-nourished. No distress.  HENT:  Head: Normocephalic.  Neck: Neck supple.  Cardiovascular: Normal rate.  Pulmonary/Chest: Effort normal. He has no wheezes.  Musculoskeletal: Normal range of motion. He exhibits no edema.  Skin: Rash noted. Rash is maculopapular.  Small areas of dry, scaling patches on multiple dorsal fingers of both hands.     ED Treatments / Results  Labs (all labs ordered are listed, but only abnormal results are displayed) Labs Reviewed - No data to display  EKG  EKG Interpretation None       Radiology No results found.  Procedures Procedures (including critical care time)  Medications Ordered in ED Medications  predniSONE (DELTASONE) tablet 60 mg (60 mg Oral  Given 04/09/17 2231)     Initial Impression / Assessment and Plan / ED Course  I have reviewed the triage vital signs and the nursing notes.  Pertinent labs & imaging results that were available during my care of the patient were reviewed by me and considered in my medical decision making (see chart for details).     Pt with hand dermatitis. With help of pharmacy tech we were able to find a coupon for this barrier cream, $75 out of pocket which he was pleased with. Also placed on prednisone taper to help get current rash under better control. Discussed other potential barrier creams such as zinc oxide but may not work as well as the [employer]. Plan f/u with  Dr. Margo AyeHall prn.  Final Clinical Impressions(s) / ED Diagnoses   Final diagnoses:  Rash    ED Discharge Orders        Ordered    Dermatological Products, Misc. (TETRIX) CREA  Daily     04/09/17 2235    predniSONE (DELTASONE) 10 MG tablet     04/09/17 2235       Burgess Amordol, Lochlin Eppinger, PA-C 04/10/17 1248    Vanetta MuldersZackowski, Scott, MD 04/10/17 [phone]

## 2017-06-23 ENCOUNTER — Other Ambulatory Visit: Payer: Self-pay

## 2017-06-23 ENCOUNTER — Emergency Department (HOSPITAL_COMMUNITY)
Admission: EM | Admit: 2017-06-23 | Discharge: 2017-06-23 | Disposition: A | Discharge status: Home / Self Care | Payer: PRIVATE HEALTH INSURANCE | Attending: Emergency Medicine | Admitting: Emergency Medicine

## 2017-06-23 ENCOUNTER — Encounter (HOSPITAL_COMMUNITY): Payer: Self-pay | Admitting: Emergency Medicine

## 2017-06-23 DIAGNOSIS — Z79899 Other long term (current) drug therapy: Secondary | ICD-10-CM | POA: Insufficient documentation

## 2017-06-23 DIAGNOSIS — R21 Rash and other nonspecific skin eruption: Secondary | ICD-10-CM | POA: Diagnosis present

## 2017-06-23 DIAGNOSIS — F17210 Nicotine dependence, cigarettes, uncomplicated: Secondary | ICD-10-CM | POA: Diagnosis not present

## 2017-06-23 DIAGNOSIS — L245 Irritant contact dermatitis due to other chemical products: Secondary | ICD-10-CM | POA: Insufficient documentation

## 2017-06-23 MED ORDER — TETRIX EX CREA: 1.0000 "application " | TOPICAL_CREAM | Freq: Every day | CUTANEOUS | 2 refills | Status: DC

## 2017-06-23 MED ORDER — METHYLPREDNISOLONE SODIUM SUCC 125 MG IJ SOLR
125.0000 mg | Freq: Once | INTRAMUSCULAR | Status: AC
  Administered 2017-06-23: 125 mg via INTRAMUSCULAR
  Filled 2017-06-23: qty 2

## 2017-06-23 NOTE — ED Provider Notes (Signed)
[facility] EMERGENCY DEPARTMENT Provider Note   CSN: [id number] Arrival date & time: 06/23/17  2117     History   Chief Complaint Chief Complaint  Patient presents with  . Rash    HPI Christian Ali is a 42 y.o. male.  The history is provided by the patient. No language interpreter was used.  Rash   This is a new problem. Episode onset: years. The problem has not changed since onset.The problem is associated with nothing. There has been no fever. The rash is present on the right arm and left hand. The pain is mild. The pain has been constant since onset. Associated symptoms include blisters and itching. He has tried nothing for the symptoms. The treatment provided no relief.  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  Pt reports he is out of the cream he uses.  Pt reports his Md gave him a shot in the past that helped when rash was bad.  History reviewed. No pertinent past medical history.  There are no active problems to display for this patient.   Past Surgical History:  Procedure Laterality Date  . SHOULDER SURGERY          Home Medications    Prior to Admission medications   Medication Sig Start Date End Date Taking? Authorizing Provider  Dermatological Products, Misc. (TETRIX) CREA Apply 1 application topically daily. 06/23/17   Elson Areas, PA-C  predniSONE (DELTASONE) 10 MG tablet Take 6 tablets day one, 5 tablets day two, 4 tablets day three, 3 tablets day four, 2 tablets day five, then 1 tablet day six 04/09/17   Burgess Amor, PA-C    Family History No family history on file.  Social History Social History   Tobacco Use  . Smoking status: Current Every Day Smoker    Packs/day: 1.00    Types: Cigarettes  . Smokeless tobacco: Never Used  Substance Use Topics  . Alcohol use: Yes    Comment: occasionally  . Drug use: Yes    Types: Marijuana    Comment: occ     Allergies   Patient has no known allergies.   Review of  Systems Review of Systems  Skin: Positive for itching and rash.  All other systems reviewed and are negative.    Physical Exam Updated Vital Signs BP (!) 144/91   Pulse 75   Temp 98.5 F (36.9 C)   Resp 17   Ht  (1.651 m)   Wt 64.4 kg (142 lb)   SpO2 99%   BMI 23.63 kg/m   Physical Exam  Constitutional: He appears well-developed and well-nourished.  HENT:  Head: Normocephalic.  Musculoskeletal: He exhibits tenderness.  Dry scaly rash hands and lower arms.    Neurological: He is alert.  Skin: Skin is warm.  Psychiatric: He has a normal mood and affect.  Nursing note and vitals reviewed.    ED Treatments / Results  Labs (all labs ordered are listed, but only abnormal results are displayed) Labs Reviewed - No data to display  EKG None  Radiology No results found.  Procedures Procedures (including critical care time)  Medications Ordered in ED Medications  methylPREDNISolone sodium succinate (SOLU-MEDROL) 125 mg/2 mL injection 125 mg (has no administration in time range)     Initial Impression / Assessment and Plan / ED Course  I have reviewed the triage vital signs and the nursing notes.  Pertinent labs & imaging results that were available during  my care of the patient were reviewed by me and considered in my medical decision making (see chart for details).     Rash looks like contact dermatitis.  The cream pt was on is tetrix.  Pt given rx.  Solumedrol Im.An After Visit Summary was printed and given to the patient.   Final Clinical Impressions(s) / ED Diagnoses   Final diagnoses:  Irritant contact dermatitis due to other chemical products    ED Discharge Orders        Ordered    Dermatological Products, Misc. [facility]) CREA  Daily     06/23/17 2314       Osie Cheeks 06/23/17 2319    Doug Sou, MD 06/24/17 [phone]

## 2017-06-23 NOTE — ED Triage Notes (Signed)
Pt c/o rash to bilateral hands.

## 2017-06-23 NOTE — Discharge Instructions (Signed)
See your Physician for recheck.  °

## 2017-08-18 ENCOUNTER — Encounter (HOSPITAL_COMMUNITY): Payer: Self-pay | Admitting: Emergency Medicine

## 2017-08-18 ENCOUNTER — Emergency Department (HOSPITAL_COMMUNITY)
Admission: EM | Admit: 2017-08-18 | Discharge: 2017-08-18 | Disposition: A | Discharge status: Home / Self Care | Payer: PRIVATE HEALTH INSURANCE | Attending: Emergency Medicine | Admitting: Emergency Medicine

## 2017-08-18 ENCOUNTER — Other Ambulatory Visit: Payer: Self-pay

## 2017-08-18 DIAGNOSIS — M25511 Pain in right shoulder: Secondary | ICD-10-CM | POA: Diagnosis present

## 2017-08-18 DIAGNOSIS — F17210 Nicotine dependence, cigarettes, uncomplicated: Secondary | ICD-10-CM | POA: Diagnosis not present

## 2017-08-18 MED ORDER — ACETAMINOPHEN 500 MG PO TABS
1000.0000 mg | ORAL_TABLET | Freq: Once | ORAL | Status: AC
  Administered 2017-08-18: 1000 mg via ORAL
  Filled 2017-08-18: qty 2

## 2017-08-18 MED ORDER — KETOROLAC TROMETHAMINE 10 MG PO TABS
10.0000 mg | ORAL_TABLET | Freq: Once | ORAL | Status: AC
  Administered 2017-08-18: 10 mg via ORAL
  Filled 2017-08-18: qty 1

## 2017-08-18 MED ORDER — CYCLOBENZAPRINE HCL 10 MG PO TABS: 10.0000 mg | ORAL_TABLET | Freq: Three times a day (TID) | ORAL | 0 refills | Status: DC

## 2017-08-18 MED ORDER — MELOXICAM 15 MG PO TABS: 15.0000 mg | ORAL_TABLET | Freq: Every day | ORAL | 0 refills | Status: DC

## 2017-08-18 NOTE — ED Provider Notes (Signed)
[facility] EMERGENCY DEPARTMENT Provider Note   CSN: [id number] Arrival date & time: 08/18/17  1924     History   Chief Complaint Chief Complaint  Patient presents with  . Shoulder Pain    HPI Ellie BOYKIN BAETZ is a 42 y.o. male.  Patient is a 42 year old male who presents to the emergency department with a complaint of right shoulder pain.  The patient states that 2 to 3 days ago he lifted a heavy cooler, he felt his shoulder pop, and he has been having pain since that time.  It is of note that approximately 20 years ago the patient had surgery for repair of her rotator cuff.  He also had problems with his shoulder going in and out of joint and had some small bony fragments noted in the capsular area.  The patient states he has some problems with his shoulder from time to time, but they usually do not last this long and or not this intense.  The patient states he feels a funny crunching type feeling when he works his shoulder in a circle, and he came to the emergency department for additional evaluation.     History reviewed. No pertinent past medical history.  There are no active problems to display for this patient.   Past Surgical History:  Procedure Laterality Date  . SHOULDER SURGERY          Home Medications    Prior to Admission medications   Medication Sig Start Date End Date Taking? Authorizing Provider  Dermatological Products, Misc. (TETRIX) CREA Apply 1 application topically daily. 06/23/17   Elson Areas, PA-C  predniSONE (DELTASONE) 10 MG tablet Take 6 tablets day one, 5 tablets day two, 4 tablets day three, 3 tablets day four, 2 tablets day five, then 1 tablet day six 04/09/17   Burgess Amor, PA-C    Family History No family history on file.  Social History Social History   Tobacco Use  . Smoking status: Current Every Day Smoker    Packs/day: 1.00    Types: Cigarettes  . Smokeless tobacco: Never Used  Substance Use Topics  . Alcohol use: Yes      Comment: occasionally  . Drug use: Yes    Types: Marijuana    Comment: occ     Allergies   Patient has no known allergies.   Review of Systems Review of Systems  Constitutional: Negative for activity change.       All ROS Neg except as noted in HPI  HENT: Negative for nosebleeds.   Eyes: Negative for photophobia and discharge.  Respiratory: Negative for cough, shortness of breath and wheezing.   Cardiovascular: Negative for chest pain and palpitations.  Gastrointestinal: Negative for abdominal pain and blood in stool.  Genitourinary: Negative for dysuria, frequency and hematuria.  Musculoskeletal: Positive for arthralgias. Negative for back pain and neck pain.       Shoulder pain  Skin: Negative.   Neurological: Negative for dizziness, seizures and speech difficulty.  Psychiatric/Behavioral: Negative for confusion and hallucinations.     Physical Exam Updated Vital Signs BP (!) 147/92 (BP Location: Right Arm)   Pulse 73   Temp 98.6 F (37 C) (Oral)   Resp 17   Ht 5\' 6"  (1.676 m)   Wt 63.5 kg (140 lb)   SpO2 97%   BMI 22.60 kg/m   Physical Exam  Constitutional: He is oriented to person, place, and time. He appears well-developed and well-nourished.  Non-toxic appearance.  HENT:  Head: Normocephalic.  Right Ear: Tympanic membrane and external ear normal.  Left Ear: Tympanic membrane and external ear normal.  Eyes: Pupils are equal, round, and reactive to light. EOM and lids are normal.  Neck: Normal range of motion. Neck supple. Carotid bruit is not present.  Cardiovascular: Normal rate, regular rhythm, normal heart sounds, intact distal pulses and normal pulses.  Pulmonary/Chest: Breath sounds normal. No respiratory distress.  Abdominal: Soft. Bowel sounds are normal. There is no tenderness. There is no guarding.  Musculoskeletal: Normal range of motion.  There is no evidence of dislocation of the right shoulder.  The patient has good range of motion of the  shoulder.  There is crepitus present.  There is no hot joints appreciated.  There is no significant deformity of the bicep tricep area.  There is full range of motion of the right elbow, wrist, and fingers.  Radial pulses 2+.  Capillary refill is less than 2 seconds.  Lymphadenopathy:       Head (right side): No submandibular adenopathy present.       Head (left side): No submandibular adenopathy present.    He has no cervical adenopathy.  Neurological: He is alert and oriented to person, place, and time. He has normal strength. No cranial nerve deficit or sensory deficit.  There are no gross sensory or motor deficits of the right or left upper extremity.  Skin: Skin is warm and dry.  Psychiatric: He has a normal mood and affect. His speech is normal.  Nursing note and vitals reviewed.    ED Treatments / Results  Labs (all labs ordered are listed, but only abnormal results are displayed) Labs Reviewed - No data to display  EKG None  Radiology No results found.  Procedures Procedures (including critical care time)  Medications Ordered in ED Medications - No data to display   Initial Impression / Assessment and Plan / ED Course  I have reviewed the triage vital signs and the nursing notes.  Pertinent labs & imaging results that were available during my care of the patient were reviewed by me and considered in my medical decision making (see chart for details).       Final Clinical Impressions(s) / ED Diagnose MDM  Patient picked up a heavy object a few days ago, this was followed by a pop and pain in the right shoulder.  The patient was concern for dislocation, as he has had problems with dislocation in the past.  He had surgery on his shoulder approximately 20 years ago for this same problem.  There is no evidence for dislocation.  There are no neurologic or vascular deficits appreciated.  I suspect that the patient has a strain of the shoulder, aggravated by degenerative  changes and scar tissue from previous surgery.  Patient will be treated with Flexeril for the spasm pain.  He will use meloxicam daily over the next for 5 days.  I have asked the patient to see Dr. Romeo Apple for orthopedic evaluation if this pain and discomfort continues.  Patient is in agreement with this plan.   Final diagnoses:  Acute pain of right shoulder    ED Discharge Orders        Ordered    cyclobenzaprine (FLEXERIL) 10 MG tablet  3 times daily     08/18/17 2051    meloxicam (MOBIC) 15 MG tablet  Daily     08/18/17 2051       Ivery Quale, PA-C 08/18/17 2101  Donnetta Hutchingook, Brian, MD 08/19/17 [phone]

## 2017-08-18 NOTE — ED Triage Notes (Signed)
Pt states his right shoulder popped out a couple of days ago and states he has had pain since.

## 2017-08-18 NOTE — ED Notes (Signed)
ED Provider at bedside. 

## 2017-08-18 NOTE — Discharge Instructions (Addendum)
There is no evidence of dislocation on your examination at this time.  There are no vascular or neurologic deficits involving your right upper extremity.  I suspect that the changes in your shoulder are related to arthritis from previous injury to your shoulder, scar tissue from previous surgery, and inflammation from use stretching and straining the shoulder recently.  Please use meloxicam daily with a meal.  Please use Flexeril 3 times daily for spasm in the muscles. This medication may cause drowsiness. Please do not drink, drive, or participate in activity that requires concentration while taking this medication.  Please see Dr. Romeo AppleHarrison for orthopedic evaluation and management if this pain continues.

## 2019-05-31 ENCOUNTER — Encounter (HOSPITAL_COMMUNITY): Payer: Self-pay | Admitting: Emergency Medicine

## 2019-05-31 ENCOUNTER — Emergency Department (HOSPITAL_COMMUNITY)
Admission: EM | Admit: 2019-05-31 | Discharge: 2019-05-31 | Disposition: A | Discharge status: Home / Self Care | Payer: PRIVATE HEALTH INSURANCE | Attending: Emergency Medicine | Admitting: Emergency Medicine

## 2019-05-31 ENCOUNTER — Other Ambulatory Visit: Payer: Self-pay

## 2019-05-31 DIAGNOSIS — L253 Unspecified contact dermatitis due to other chemical products: Secondary | ICD-10-CM | POA: Insufficient documentation

## 2019-05-31 DIAGNOSIS — F17210 Nicotine dependence, cigarettes, uncomplicated: Secondary | ICD-10-CM | POA: Insufficient documentation

## 2019-05-31 MED ORDER — TRIAMCINOLONE ACETONIDE 0.1 % EX CREA: 1.0000 "application " | TOPICAL_CREAM | Freq: Two times a day (BID) | CUTANEOUS | 0 refills | Status: DC

## 2019-05-31 NOTE — Discharge Instructions (Signed)
See your doctor as needed, apply this ointment twice a day for the next 30 days or until the rash completely goes away.  You must keep your arms covered because if you continue to touch this chemical we will continue to cause a rash.

## 2019-05-31 NOTE — ED Triage Notes (Signed)
Pt c/o rash to bilateral forearms to elbows. Pt states it has been there for the p[ast 2 wks. OTC creams have not helped.

## 2019-05-31 NOTE — ED Provider Notes (Signed)
[facility] EMERGENCY DEPARTMENT Provider Note   CSN: [id number] Arrival date & time: 05/31/19  2120     History Chief Complaint  Patient presents with  . Rash    Christian Ali is a 44 y.o. male.  HPI   The patient reports that he get a rash on his arms every year at this time, he works with a specific special concrete, the rash is itchy, bumpy, has been going on for couple of weeks, not seeming to get any better with over-the-counter medications, it is not spreading.  It is not causing any fevers or chills  History reviewed. No pertinent past medical history.  There are no problems to display for this patient.   Past Surgical History:  Procedure Laterality Date  . SHOULDER SURGERY         No family history on file.  Social History   Tobacco Use  . Smoking status: Current Every Day Smoker    Packs/day: 1.00    Types: Cigarettes  . Smokeless tobacco: Never Used  Substance Use Topics  . Alcohol use: Yes    Comment: occasionally  . Drug use: Yes    Types: Marijuana    Comment: occ    Home Medications Prior to Admission medications   Medication Sig Start Date End Date Taking? Authorizing Provider  hydrocortisone cream 1 % Apply 1 application topically once as needed for itching.   Yes [provider]  triamcinolone cream (KENALOG) 0.1 % Apply 1 application topically 2 (two) times daily. 05/31/19   Eber Hong, MD    Allergies    Patient has no known allergies.  Review of Systems   Review of Systems  Constitutional: Negative for fever.  Skin: Positive for rash.    Physical Exam Updated Vital Signs BP (!) 145/76   Pulse 91   Temp 98.4 F (36.9 C)   Resp 18   Ht 1.651 m (5\' 5" )   Wt 61.2 kg   SpO2 100%   BMI 22.47 kg/m   Physical Exam Vitals and nursing note reviewed.  Constitutional:      Appearance: He is well-developed. He is not diaphoretic.  HENT:     Head: Normocephalic and atraumatic.  Eyes:     General:        Right  eye: No discharge.        Left eye: No discharge.     Conjunctiva/sclera: Conjunctivae normal.  Pulmonary:     Effort: Pulmonary effort is normal. No respiratory distress.  Skin:    General: Skin is warm and dry.     Findings: No erythema or rash.     Comments: Papular dermatitis to the bilateral volar forearms  Neurological:     Mental Status: He is alert.     Coordination: Coordination normal.     ED Results / Procedures / Treatments   Labs (all labs ordered are listed, but only abnormal results are displayed) Labs Reviewed - No data to display  EKG None  Radiology No results found.  Procedures Procedures (including critical care time)  Medications Ordered in ED Medications - No data to display  ED Course  I have reviewed the triage vital signs and the nursing notes.  Pertinent labs & imaging results that were available during my care of the patient were reviewed by me and considered in my medical decision making (see chart for details).    MDM Rules/Calculators/A&P  Likely dermatitis, contact, needs triamcinolone cream, reassurance given, does not appear to be consistent with infection or zoster  Final Clinical Impression(s) / ED Diagnoses Final diagnoses:  Contact dermatitis due to other chemical product, unspecified contact dermatitis type    Rx / DC Orders ED Discharge Orders         Ordered    triamcinolone cream (KENALOG) 0.1 %  2 times daily     05/31/19 2314           Noemi Chapel, MD 05/31/19 2315

## 2020-02-06 ENCOUNTER — Emergency Department (HOSPITAL_COMMUNITY): Payer: Managed Care, Other (non HMO)

## 2020-02-06 ENCOUNTER — Emergency Department (HOSPITAL_COMMUNITY)
Admission: EM | Admit: 2020-02-06 | Discharge: 2020-02-06 | Disposition: A | Discharge status: Home / Self Care | Payer: Managed Care, Other (non HMO) | Attending: Emergency Medicine | Admitting: Emergency Medicine

## 2020-02-06 ENCOUNTER — Other Ambulatory Visit: Payer: Self-pay

## 2020-02-06 ENCOUNTER — Encounter (HOSPITAL_COMMUNITY): Payer: Self-pay

## 2020-02-06 DIAGNOSIS — Y92410 Unspecified street and highway as the place of occurrence of the external cause: Secondary | ICD-10-CM | POA: Diagnosis not present

## 2020-02-06 DIAGNOSIS — S30810A Abrasion of lower back and pelvis, initial encounter: Secondary | ICD-10-CM | POA: Diagnosis not present

## 2020-02-06 DIAGNOSIS — M25511 Pain in right shoulder: Secondary | ICD-10-CM | POA: Insufficient documentation

## 2020-02-06 DIAGNOSIS — F17210 Nicotine dependence, cigarettes, uncomplicated: Secondary | ICD-10-CM | POA: Insufficient documentation

## 2020-02-06 HISTORY — DX: Other specified postprocedural states: Z98.890

## 2020-02-06 MED ORDER — HYDROCODONE-ACETAMINOPHEN 5-325 MG PO TABS: 1.0000 | ORAL_TABLET | ORAL | 0 refills | Status: DC | PRN

## 2020-02-06 MED ORDER — HYDROCODONE-ACETAMINOPHEN 5-325 MG PO TABS
1.0000 | ORAL_TABLET | Freq: Once | ORAL | Status: AC
  Administered 2020-02-06: 14:00:00 1 via ORAL
  Filled 2020-02-06: qty 1

## 2020-02-06 MED ORDER — METHOCARBAMOL 500 MG PO TABS: 500.0000 mg | ORAL_TABLET | Freq: Two times a day (BID) | ORAL | 0 refills | Status: DC

## 2020-02-06 MED ORDER — LIDOCAINE 5 % EX PTCH: 1.0000 | MEDICATED_PATCH | CUTANEOUS | 0 refills | Status: DC

## 2020-02-06 NOTE — Discharge Instructions (Addendum)
Make sure to take deep breaths to ensure he did not get pneumonia. You'll likely be sore over the next few days.  I'm concerned you possibly tore your rotator cuff in your right shoulder. I will give a short course of pain medicine, muscle relaxers. Keep your arm in the sling. If symptoms are unresolved within a week I recommend following up with orthopedics.  Your blood pressure is elevated today. I would recommend following up with a primary care provider for recheck of this.  If you develop severe chest pain, coughing up blood, inability to take a deep breath, abdominal plane please seek reevaluation
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
## 2020-02-06 NOTE — ED Notes (Signed)
Brittni PA made aware of pts BP.
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
## 2020-09-10 ENCOUNTER — Ambulatory Visit
Admission: EM | Admit: 2020-09-10 | Discharge: 2020-09-10 | Disposition: A | Discharge status: Home / Self Care | Payer: Managed Care, Other (non HMO) | Attending: Emergency Medicine | Admitting: Emergency Medicine

## 2020-09-10 ENCOUNTER — Encounter: Payer: Self-pay | Admitting: Emergency Medicine

## 2020-09-10 DIAGNOSIS — L239 Allergic contact dermatitis, unspecified cause: Secondary | ICD-10-CM | POA: Diagnosis not present

## 2020-09-10 MED ORDER — DEXAMETHASONE SODIUM PHOSPHATE 10 MG/ML IJ SOLN
10.0000 mg | Freq: Once | INTRAMUSCULAR | Status: AC
  Administered 2020-09-10: 10 mg via INTRAMUSCULAR

## 2020-09-10 MED ORDER — PREDNISONE 10 MG (21) PO TBPK: ORAL_TABLET | Freq: Every day | ORAL | 0 refills | Status: DC

## 2020-09-10 MED ORDER — CETIRIZINE HCL 10 MG PO TABS: 10.0000 mg | ORAL_TABLET | Freq: Every day | ORAL | 0 refills | Status: DC

## 2020-09-10 NOTE — Discharge Instructions (Addendum)
Prescribed zyrtec andprednisone Take as prescribed and to completion Limit hot shower and baths, or bathe with warm water.   Moisturize skin daily Follow up with PCP if symptoms persists Return or go to the ER if you have any new or worsening symptoms 

## 2020-09-10 NOTE — ED Provider Notes (Signed)
[facility]   [id number] 09/10/20 Arrival Time: 1540   Chief Complaint  Patient presents with   Rash     SUBJECTIVE: History from: patient and family.  Christian Ali is a 45 y.o. male presents to the urgent care with a complaint of rash to bilateral arms and stomach for the past 2 weeks.  Denies any change in soap, detergent or new or similar symptom.  Localized the rash to abdomen and arm.  Described as itchy with scab.  Has tried OTC medication without relief.  D denies enies of any aggravating factor.  Denies similar symptoms in the past.  Denies chills, fever, nausea, vomiting, diarrhea  ROS: As per HPI.  All other pertinent ROS negative.     Past Medical History:  Diagnosis Date   H/O shoulder surgery    Past Surgical History:  Procedure Laterality Date   SHOULDER SURGERY     No Known Allergies No current facility-administered medications on file prior to encounter.   Current Outpatient Medications on File Prior to Encounter  Medication Sig Dispense Refill   HYDROcodone-acetaminophen (NORCO/VICODIN) 5-325 MG tablet Take 1 tablet by mouth every 4 (four) hours as needed. 10 tablet 0   hydrocortisone cream 1 % Apply 1 application topically once as needed for itching.     lidocaine (LIDODERM) 5 % Place 1 patch onto the skin daily. Remove & Discard patch within 12 hours or as directed by MD 30 patch 0   methocarbamol (ROBAXIN) 500 MG tablet Take 1 tablet (500 mg total) by mouth 2 (two) times daily. 20 tablet 0   triamcinolone cream (KENALOG) 0.1 % Apply 1 application topically 2 (two) times daily. 30 g 0   Social History   Socioeconomic History   Marital status: Single    Spouse name: Not on file   Number of children: Not on file   Years of education: Not on file   Highest education level: Not on file  Occupational History   Not on file  Tobacco Use   Smoking status: Every Day    Packs/day: 1.00    Types: Cigarettes   Smokeless tobacco: Never   Substance and Sexual Activity   Alcohol use: Yes    Comment: occasionally   Drug use: Yes    Types: Marijuana    Comment: occ   Sexual activity: Not on file  Other Topics Concern   Not on file  Social History Narrative   Not on file   Social Determinants of Health   Financial Resource Strain: Not on file  Food Insecurity: Not on file  Transportation Needs: Not on file  Physical Activity: Not on file  Stress: Not on file  Social Connections: Not on file  Intimate Partner Violence: Not on file   No family history on file.  OBJECTIVE:  Vitals:   09/10/20 1557  BP: (!) 142/92  Pulse: 66  Resp: 17  Temp: 98.8 F (37.1 C)  TempSrc: Oral  SpO2: 97%     Physical Exam Vitals and nursing note reviewed.  Constitutional:      General: He is not in acute distress.    Appearance: Normal appearance. He is normal weight. He is not ill-appearing, toxic-appearing or diaphoretic.  Cardiovascular:     Rate and Rhythm: Normal rate and regular rhythm.     Pulses: Normal pulses.     Heart sounds: Normal heart sounds. No murmur heard.   No friction rub. No gallop.  Pulmonary:  Effort: Pulmonary effort is normal. No respiratory distress.     Breath sounds: Normal breath sounds. No stridor. No wheezing, rhonchi or rales.  Chest:     Chest wall: No tenderness.  Skin:    Findings: Rash present. Rash is macular.     Comments: Macular rash with black scab  Neurological:     Mental Status: He is alert and oriented to person, place, and time.     LABS:  No results found for this or any previous visit (from the past 24 hour(s)).   ASSESSMENT & PLAN:  1. Allergic dermatitis     Meds ordered this encounter  Medications   dexamethasone (DECADRON) injection 10 mg   predniSONE (STERAPRED UNI-PAK 21 TAB) 10 MG (21) TBPK tablet    Sig: Take by mouth daily. Take 6 tabs by mouth daily  for 1 days, then 5 tabs for 1 days, then 4 tabs for 1 days, then 3 tabs for 1 days, 2 tabs for 1  days, then 1 tab by mouth daily for 1 days    Dispense:  21 tablet    Refill:  0   cetirizine (ZYRTEC ALLERGY) 10 MG tablet    Sig: Take 1 tablet (10 mg total) by mouth daily.    Dispense:  30 tablet    Refill:  0   Discharge instructions  Prescribed zyrtec and prednisone Take as prescribed and to completion Limit hot shower and baths, or bathe with warm water.   Moisturize skin daily Follow up with PCP if symptoms persists Return or go to the ER if you have any new or worsening symptoms   Reviewed expectations re: course of current medical issues. Questions answered. Outlined signs and symptoms indicating need for more acute intervention. Patient verbalized understanding. After Visit Summary given.          Durward Parcel, FNP 09/10/20 1645
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
## 2021-06-28 ENCOUNTER — Ambulatory Visit (INDEPENDENT_AMBULATORY_CARE_PROVIDER_SITE_OTHER): Payer: Managed Care, Other (non HMO)

## 2021-06-28 ENCOUNTER — Ambulatory Visit
Admission: EM | Admit: 2021-06-28 | Discharge: 2021-06-28 | Disposition: A | Discharge status: Nursing Home (Includes ICF) | Payer: Managed Care, Other (non HMO) | Attending: Nurse Practitioner | Admitting: Nurse Practitioner

## 2021-06-28 ENCOUNTER — Other Ambulatory Visit: Payer: Self-pay

## 2021-06-28 DIAGNOSIS — M7989 Other specified soft tissue disorders: Secondary | ICD-10-CM

## 2021-06-28 DIAGNOSIS — M79645 Pain in left finger(s): Secondary | ICD-10-CM

## 2021-06-28 DIAGNOSIS — R001 Bradycardia, unspecified: Secondary | ICD-10-CM

## 2021-06-28 MED ORDER — DOXYCYCLINE HYCLATE 100 MG PO CAPS: 100.0000 mg | ORAL_CAPSULE | Freq: Two times a day (BID) | ORAL | 0 refills | Status: DC

## 2021-06-28 NOTE — ED Triage Notes (Addendum)
Pt reports swelling in the righ middle  finger x 1 week

## 2021-06-28 NOTE — Discharge Instructions (Addendum)
Your x-rays were negative for fracture or dislocation.  There is also no foreign object seen on your x-ray. Take medication as prescribed. Epsom salt soaks 2-3 times daily to help with swelling. Apply ice to the affected finger, apply for 20 minutes, remove for 1 hour, then repeat.  Try to do this as much as possible. May take over-the-counter ibuprofen or Tylenol for pain or fever. If symptoms do not improve after completing the antibiotic follow-up in our office.  Follow-up sooner if you develop worsening swelling, redness, streaking, or other concerns.

## 2021-06-28 NOTE — ED Notes (Signed)
Patient is being discharged from the Urgent Care and sent to the Emergency Department via POV . Per NP, patient is in need of higher level of care due to bradycardia. Patient is aware and verbalizes understanding of plan of care.  Vitals:   06/28/21 1056  BP: 133/68  Pulse: (!) 37  Resp: 18  Temp: 98.7 F (37.1 C)

## 2021-06-28 NOTE — ED Provider Notes (Signed)
[facility]    CSN: [id number] Arrival date & time: 06/28/21  1002      History   Chief Complaint Chief Complaint  Patient presents with   finger problem    HPI Christian Ali is a 46 y.o. male.   The patient presents for swelling to the left middle finger.  Patient states symptoms started 1 week ago when he got "something in it".  Patient states he thought he removed wood from the finger.  He states the finger was initially swollen, then after he remove the foreign object, it went down.  He states over the last 2 to 3 days, that the finger is now swollen again.  Patient states that he continues to feel something in the finger.  He complains of numbness and tingling to the middle finger.  Patient with low heart rate during triage.  Heart rate ranged from 36-53 during the triage process.  An EKG was performed.  The history is provided by the patient.   Past Medical History:  Diagnosis Date   H/O shoulder surgery     There are no problems to display for this patient.   Past Surgical History:  Procedure Laterality Date   SHOULDER SURGERY         Home Medications    Prior to Admission medications   Medication Sig Start Date End Date Taking? Authorizing Provider  doxycycline (VIBRAMYCIN) 100 MG capsule Take 1 capsule (100 mg total) by mouth 2 (two) times daily. 06/28/21  Yes Modupe Shampine-Warren, Sadie Haberhristie J, NP  cetirizine (ZYRTEC ALLERGY) 10 MG tablet Take 1 tablet (10 mg total) by mouth daily. 09/10/20   Avegno, Zachery DakinsKomlanvi S, FNP  HYDROcodone-acetaminophen (NORCO/VICODIN) 5-325 MG tablet Take 1 tablet by mouth every 4 (four) hours as needed. 02/06/20   Henderly, Britni A, PA-C  hydrocortisone cream 1 % Apply 1 application topically once as needed for itching.    [provider]  lidocaine (LIDODERM) 5 % Place 1 patch onto the skin daily. Remove & Discard patch within 12 hours or as directed by MD 02/06/20   Henderly, Britni A, PA-C  methocarbamol (ROBAXIN)  500 MG tablet Take 1 tablet (500 mg total) by mouth 2 (two) times daily. 02/06/20   Henderly, Britni A, PA-C  predniSONE (STERAPRED UNI-PAK 21 TAB) 10 MG (21) TBPK tablet Take by mouth daily. Take 6 tabs by mouth daily  for 1 days, then 5 tabs for 1 days, then 4 tabs for 1 days, then 3 tabs for 1 days, 2 tabs for 1 days, then 1 tab by mouth daily for 1 days 09/10/20   Durward ParcelAvegno, Komlanvi S, FNP  triamcinolone cream (KENALOG) 0.1 % Apply 1 application topically 2 (two) times daily. 05/31/19   Eber HongMiller, Brian, MD    Family History History reviewed. No pertinent family history.  Social History Social History   Tobacco Use   Smoking status: Every Day    Packs/day: 1.00    Types: Cigarettes   Smokeless tobacco: Never  Substance Use Topics   Alcohol use: Yes    Comment: occasionally   Drug use: Yes    Types: Marijuana    Comment: occ     Allergies   Patient has no known allergies.   Review of Systems Review of Systems  Constitutional: Negative.   Musculoskeletal:        Left middle finger swelling  Skin: Negative.   Psychiatric/Behavioral: Negative.      Physical Exam Triage Vital Signs ED Triage Vitals  Enc Vitals Group     BP 06/28/21 1056 133/68     Pulse Rate 06/28/21 1056 (!) 37     Resp 06/28/21 1056 18     Temp 06/28/21 1056 98.7 F (37.1 C)     Temp Source 06/28/21 1056 Oral     SpO2 --      Weight --      Height --      Head Circumference --      Peak Flow --      Pain Score 06/28/21 1111 6     Pain Loc --      Pain Edu? --      Excl. in GC? --    No data found.  Updated Vital Signs BP 133/68 (BP Location: Right Arm)   Pulse (!) 37   Temp 98.7 F (37.1 C) (Oral)   Resp 18   Visual Acuity Right Eye Distance:   Left Eye Distance:   Bilateral Distance:    Right Eye Near:   Left Eye Near:    Bilateral Near:     Physical Exam Vitals and nursing note reviewed.     UC Treatments / Results  Labs (all labs ordered are listed, but only abnormal  results are displayed) Labs Reviewed - No data to display  EKG   Radiology DG Finger Middle Left  Result Date: 06/28/2021 CLINICAL DATA:  Swollen finger no injury EXAM: LEFT MIDDLE FINGER 2+V COMPARISON:  None Available. FINDINGS: Negative for fracture. No arthropathy. Diffuse soft tissue swelling. No foreign body. No gas in the soft tissues IMPRESSION: Diffuse soft tissue swelling of the third finger. No arthropathy or fracture. Electronically Signed   By: Marlan Palau M.D.   On: 06/28/2021 11:54    Procedures Procedures (including critical care time)  Medications Ordered in UC Medications - No data to display  Initial Impression / Assessment and Plan / UC Course  I have reviewed the triage vital signs and the nursing notes.  Pertinent labs & imaging results that were available during my care of the patient were reviewed by me and considered in my medical decision making (see chart for details).  The patient is a 46 year old male who presents with swelling to the left middle finger.  Symptoms have been present for the past week, but have worsened over the past 2 to 3 days.  Patient states that he thinks he has something in his finger.  X-rays were done which showed no foreign object, did show soft tissue swelling.  Explained to the patient that it would not be beneficial to try to obtain an object that is not visible or certain as this would expose him to risk of infection.  We will start the patient on doxycycline prophylactically to see if this helps his symptoms.  Also recommended Epsom salt soaks, ibuprofen or Tylenol, and ice to the affected digit.  Patient was advised to follow-up if he has worsening swelling, redness, streaking, or continued symptoms after completing the antibiotic.  With regard to his heart rate, patient was left on the pulse ox in his room, his heart rate increased to 68-76 while in the room.  Patient is asymptomatic at this time.  He denies any shortness of breath,  fatigue, chest pain, or difficulty breathing.  After speaking with the patient, he is in agreement with going to the ER for further evaluation.  The patient's vital signs are stable at this time heart rate is 68, he is asymptomatic, patient is  able to travel by private vehicle to the ER. Final Clinical Impressions(s) / UC Diagnoses   Final diagnoses:  Swelling of left middle finger  Bradycardia     Discharge Instructions      Your x-rays were negative for fracture or dislocation.  There is also no foreign object seen on your x-ray. Take medication as prescribed. Epsom salt soaks 2-3 times daily to help with swelling. Apply ice to the affected finger, apply for 20 minutes, remove for 1 hour, then repeat.  Try to do this as much as possible. May take over-the-counter ibuprofen or Tylenol for pain or fever. If symptoms do not improve after completing the antibiotic follow-up in our office.  Follow-up sooner if you develop worsening swelling, redness, streaking, or other concerns.     ED Prescriptions     Medication Sig Dispense Auth. Provider   doxycycline (VIBRAMYCIN) 100 MG capsule Take 1 capsule (100 mg total) by mouth 2 (two) times daily. 20 capsule Makenzie Weisner-Warren, Sadie Haber, NP      PDMP not reviewed this encounter.   Abran Cantor, NP 06/28/21 1238

## 2022-02-19 ENCOUNTER — Emergency Department (HOSPITAL_COMMUNITY): Payer: Managed Care, Other (non HMO)

## 2022-02-19 ENCOUNTER — Encounter (HOSPITAL_COMMUNITY): Payer: Self-pay | Admitting: Emergency Medicine

## 2022-02-19 ENCOUNTER — Other Ambulatory Visit: Payer: Self-pay

## 2022-02-19 ENCOUNTER — Emergency Department (HOSPITAL_COMMUNITY)
Admission: EM | Admit: 2022-02-19 | Discharge: 2022-02-19 | Disposition: A | Discharge status: Home / Self Care | Payer: Managed Care, Other (non HMO) | Attending: Student | Admitting: Student

## 2022-02-19 DIAGNOSIS — G8929 Other chronic pain: Secondary | ICD-10-CM

## 2022-02-19 DIAGNOSIS — M545 Low back pain, unspecified: Secondary | ICD-10-CM | POA: Diagnosis not present

## 2022-02-19 DIAGNOSIS — M549 Dorsalgia, unspecified: Secondary | ICD-10-CM | POA: Diagnosis present

## 2022-02-19 MED ORDER — DEXAMETHASONE SODIUM PHOSPHATE 10 MG/ML IJ SOLN
10.0000 mg | Freq: Once | INTRAMUSCULAR | Status: AC
  Administered 2022-02-19: 10 mg via INTRAMUSCULAR
  Filled 2022-02-19: qty 1

## 2022-02-19 MED ORDER — NAPROXEN 500 MG PO TABS: 500.0000 mg | ORAL_TABLET | Freq: Two times a day (BID) | ORAL | 0 refills | Status: DC

## 2022-02-19 MED ORDER — LIDOCAINE 5 % EX PTCH
1.0000 | MEDICATED_PATCH | CUTANEOUS | Status: DC
  Administered 2022-02-19: 1 via TRANSDERMAL
  Filled 2022-02-19: qty 1

## 2022-02-19 MED ORDER — KETOROLAC TROMETHAMINE 60 MG/2ML IM SOLN
15.0000 mg | Freq: Once | INTRAMUSCULAR | Status: AC
  Administered 2022-02-19: 15 mg via INTRAMUSCULAR
  Filled 2022-02-19: qty 2

## 2022-02-19 NOTE — ED Triage Notes (Signed)
Pt with c/o lower back pain that radiates into R hip. Denies any injury.

## 2022-02-19 NOTE — ED Notes (Signed)
Pt's HR on monitor dropped to 38. Radial pulse checked for confirmation and HR was counted at 42. Pt asymptomatic but stated similar thing happened to him last week at urgent care.
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
## 2022-02-19 NOTE — Discharge Instructions (Addendum)
You are seen today for low back pain.  Your CT scan showed bulging disks at L3-L4, L4-L5 and L5-S1 with other degenerative changes.  You have no signs today of spinal cord problem.  Follow-up with back specialist.  You can also follow-up with primary care generally.  One of your ED blood pressure readings close elevated.  You should have this rechecked as an outpatient.  [facility] Primary Care Doctor List    Tula Nakayama, MD. Specialty: [facility] Medicine Contact information: [address] [address]  [phone]   Sallee Lange, MD. Specialty: [facility] Medicine Contact information: [place]  [place] [address]  [phone]   Rosita Fire, MD Specialty: Internal Medicine Contact information: [place] [address]  [phone]   Delphina Cahill, MD. Specialty: Internal Medicine Contact information: [place] [address]  [phone]    [facility] (Dr. Maudie Mercury) Specialty: Family Medicine Contact information: [place] [address]  [phone]   Leslie Andrea, MD. Specialty: [facility] Medicine Contact information: [place] [address]  [phone]   Asencion Noble, MD. Specialty: Internal Medicine Contact information: [address]  [place] [address]  [place]  [address] [place], [place] [address] [phone]  Services [facility] offers a variety of basic health services.  Services include but are not limited to: Blood pressure checks  Heart rate checks  Blood sugar checks  Urine analysis  Rapid strep tests  Pregnancy tests.  Health education and referrals  People needing more complex services will be directed to a physician online. Using these virtual visits, doctors can evaluate and prescribe medicine and  treatments. There will be no medication on-site, though [facility] will help patients fill their prescriptions at little to no cost.   For More information please go to: [email or web address]

## 2022-11-06 IMAGING — DX DG FINGER MIDDLE 2+V*L*
3 series · 3 of 3 positions shown · non-contrast
Comparison: None Available.

CLINICAL DATA: Swollen finger no injury

EXAM:
LEFT MIDDLE FINGER 2+V

[thumb mlo]
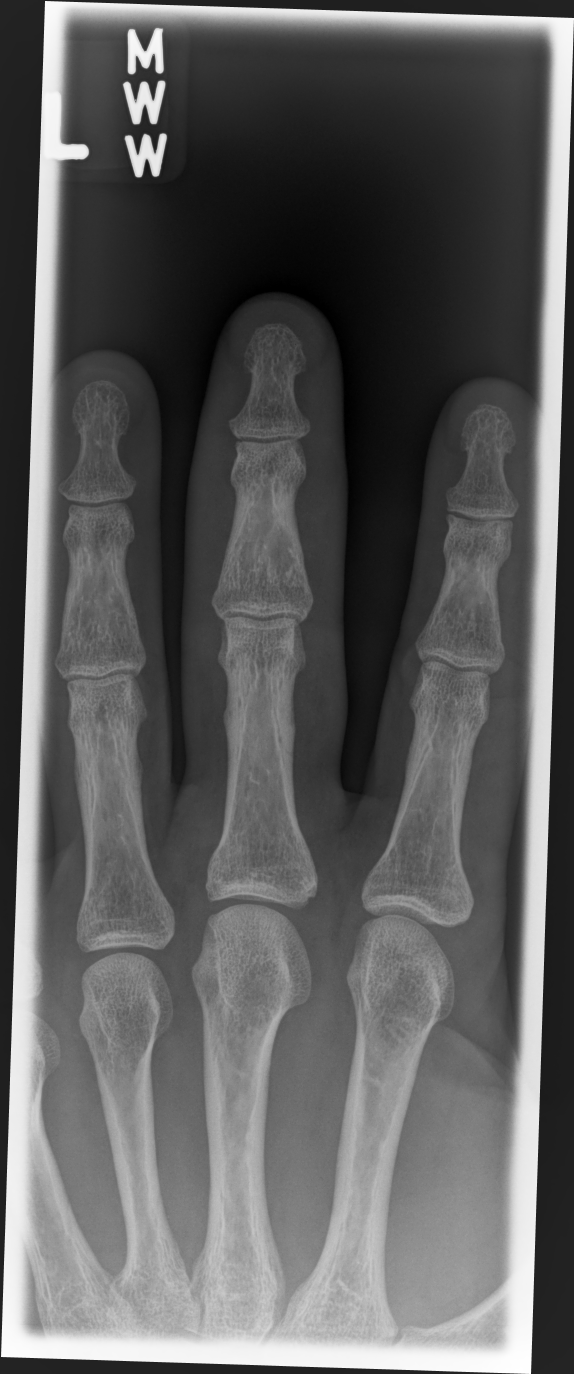

[thumb lat]
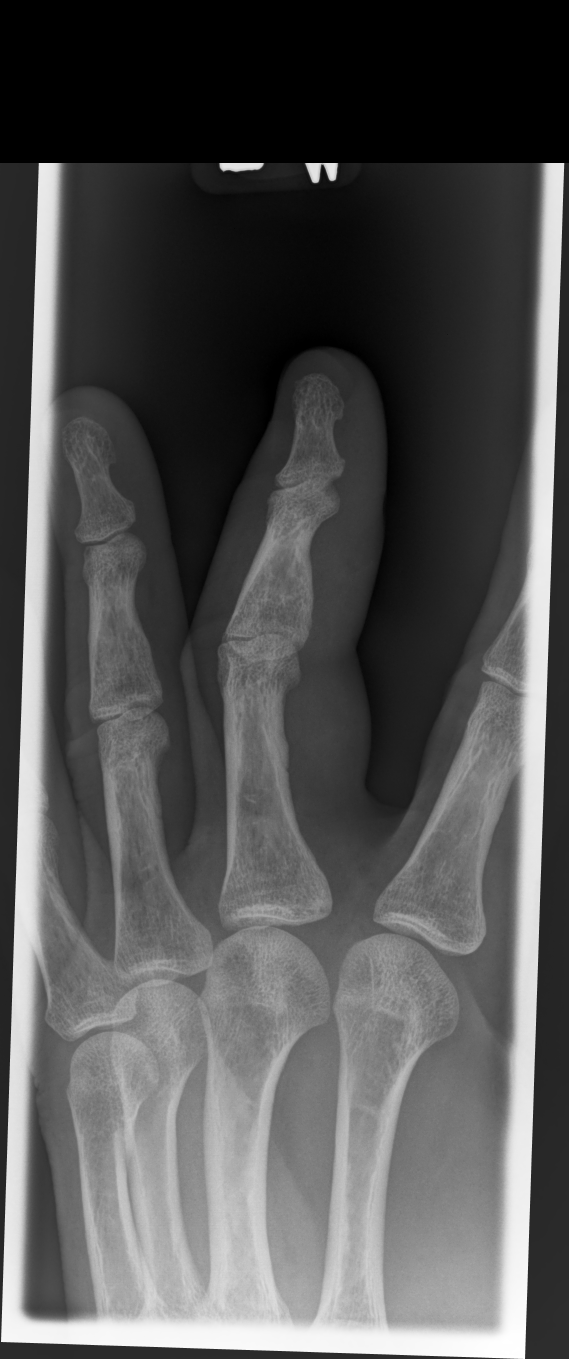

[thumb ap]
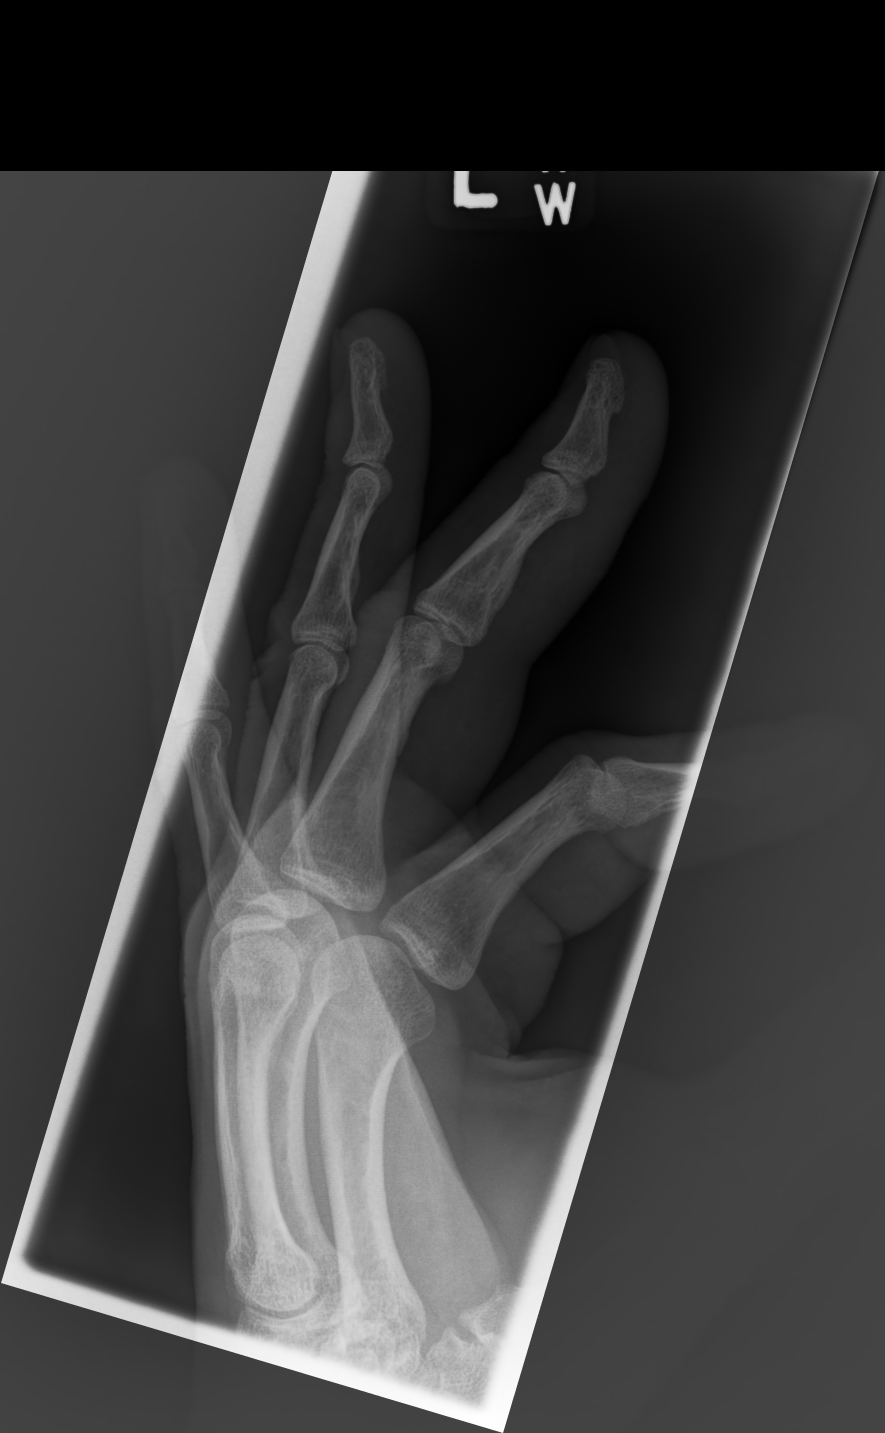

[3 of 3 positions shown; findings below may reference images not displayed]

FINDINGS: Negative for fracture. No arthropathy. Diffuse soft tissue swelling.
No foreign body. No gas in the soft tissues
IMPRESSION: Diffuse soft tissue swelling of the third finger. No arthropathy or
fracture.

## 2023-08-11 ENCOUNTER — Emergency Department (HOSPITAL_COMMUNITY)

## 2023-08-11 ENCOUNTER — Encounter (HOSPITAL_COMMUNITY): Payer: Self-pay

## 2023-08-11 ENCOUNTER — Other Ambulatory Visit: Payer: Self-pay

## 2023-08-11 ENCOUNTER — Observation Stay (HOSPITAL_COMMUNITY)
Admission: EM | Admit: 2023-08-11 | Discharge: 2023-08-14 | Disposition: A | Discharge status: Home / Self Care | Attending: Internal Medicine | Admitting: Internal Medicine

## 2023-08-11 ENCOUNTER — Other Ambulatory Visit (HOSPITAL_COMMUNITY): Payer: Self-pay | Admitting: *Deleted

## 2023-08-11 ENCOUNTER — Observation Stay (HOSPITAL_COMMUNITY)

## 2023-08-11 DIAGNOSIS — I493 Ventricular premature depolarization: Secondary | ICD-10-CM

## 2023-08-11 DIAGNOSIS — I5043 Acute on chronic combined systolic (congestive) and diastolic (congestive) heart failure: Secondary | ICD-10-CM | POA: Diagnosis not present

## 2023-08-11 DIAGNOSIS — I10 Essential (primary) hypertension: Secondary | ICD-10-CM | POA: Diagnosis not present

## 2023-08-11 DIAGNOSIS — R079 Chest pain, unspecified: Secondary | ICD-10-CM

## 2023-08-11 DIAGNOSIS — R0789 Other chest pain: Principal | ICD-10-CM | POA: Insufficient documentation

## 2023-08-11 DIAGNOSIS — I110 Hypertensive heart disease with heart failure: Secondary | ICD-10-CM | POA: Diagnosis not present

## 2023-08-11 DIAGNOSIS — Z79899 Other long term (current) drug therapy: Secondary | ICD-10-CM | POA: Insufficient documentation

## 2023-08-11 DIAGNOSIS — F17210 Nicotine dependence, cigarettes, uncomplicated: Secondary | ICD-10-CM | POA: Insufficient documentation

## 2023-08-11 DIAGNOSIS — I472 Ventricular tachycardia, unspecified: Secondary | ICD-10-CM

## 2023-08-11 DIAGNOSIS — K219 Gastro-esophageal reflux disease without esophagitis: Secondary | ICD-10-CM | POA: Diagnosis not present

## 2023-08-11 DIAGNOSIS — I428 Other cardiomyopathies: Secondary | ICD-10-CM | POA: Insufficient documentation

## 2023-08-11 DIAGNOSIS — I471 Supraventricular tachycardia, unspecified: Secondary | ICD-10-CM

## 2023-08-11 LAB — COMPREHENSIVE METABOLIC PANEL WITH GFR
ALT: 22 U/L (ref 0–44)
AST: 30 U/L (ref 15–41)
Albumin: 4.2 g/dL (ref 3.5–5.0)
Alkaline Phosphatase: 69 U/L (ref 38–126)
Anion gap: 11 (ref 5–15)
BUN: 16 mg/dL (ref 6–20)
CO2: 23 mmol/L (ref 22–32)
Calcium: 8.6 mg/dL — ABNORMAL LOW (ref 8.9–10.3)
Chloride: 105 mmol/L (ref 98–111)
Creatinine, Ser: 0.85 mg/dL (ref 0.61–1.24)
GFR, Estimated: >60 mL/min (ref >60)
Glucose, Bld: 87 mg/dL (ref 70–99)
Potassium: 3.7 mmol/L (ref 3.5–5.1)
Sodium: 139 mmol/L (ref 135–145)
Total Bilirubin: 0.3 mg/dL (ref 0.0–1.2)
Total Protein: 7 g/dL (ref 6.5–8.1)

## 2023-08-11 LAB — ECHOCARDIOGRAM COMPLETE
AR max vel: 1.71 cm2
AV Area VTI: 1.78 cm2
AV Area mean vel: 1.64 cm2
AV Mean grad: 4.3 mmHg
AV Peak grad: 8.4 mmHg
Ao pk vel: 1.45 m/s
Area-P 1/2: 3.27 cm2
Height: 65 in
S' Lateral: 5.4 cm
Weight: 2192 [oz_av]

## 2023-08-11 LAB — CBC WITH DIFFERENTIAL/PLATELET
Abs Immature Granulocytes: 0 10*3/uL (ref 0.00–0.07)
Basophils Absolute: 0 10*3/uL (ref 0.0–0.1)
Basophils Relative: 1 %
Eosinophils Absolute: 0 10*3/uL (ref 0.0–0.5)
Eosinophils Relative: 1 %
HCT: 42.8 % (ref 39.0–52.0)
Hemoglobin: 14.4 g/dL (ref 13.0–17.0)
Immature Granulocytes: 0 %
Lymphocytes Relative: 48 %
Lymphs Abs: 1.5 10*3/uL (ref 0.7–4.0)
MCH: 32.5 pg (ref 26.0–34.0)
MCHC: 33.6 g/dL (ref 30.0–36.0)
MCV: 96.6 fL (ref 80.0–100.0)
Monocytes Absolute: 0.3 10*3/uL (ref 0.1–1.0)
Monocytes Relative: 11 %
Neutro Abs: 1.2 10*3/uL — ABNORMAL LOW (ref 1.7–7.7)
Neutrophils Relative %: 39 %
Platelets: 187 10*3/uL (ref 150–400)
RBC: 4.43 MIL/uL (ref 4.22–5.81)
RDW: 12.8 % (ref 11.5–15.5)
WBC: 3 10*3/uL — ABNORMAL LOW (ref 4.0–10.5)
nRBC: 0 % (ref 0.0–0.2)

## 2023-08-11 LAB — TROPONIN I (HIGH SENSITIVITY)
Troponin I (High Sensitivity): 7 ng/L (ref <18)
Troponin I (High Sensitivity): 9 ng/L (ref <18)

## 2023-08-11 MED ORDER — LOSARTAN POTASSIUM 25 MG PO TABS
25.0000 mg | ORAL_TABLET | Freq: Every day | ORAL | Status: DC
  Administered 2023-08-11 – 2023-08-12 (×2): 25 mg via ORAL
  Filled 2023-08-11 (×2): qty 1

## 2023-08-11 MED ORDER — ONDANSETRON HCL 4 MG PO TABS: 4.0000 mg | ORAL_TABLET | Freq: Four times a day (QID) | ORAL | Status: DC | PRN

## 2023-08-11 MED ORDER — ENOXAPARIN SODIUM 40 MG/0.4ML IJ SOSY
40.0000 mg | PREFILLED_SYRINGE | INTRAMUSCULAR | Status: DC
Start: 2023-08-11 — End: 2023-08-12
  Administered 2023-08-11: 40 mg via SUBCUTANEOUS
  Filled 2023-08-11: qty 0.4

## 2023-08-11 MED ORDER — CARVEDILOL 3.125 MG PO TABS
3.1250 mg | ORAL_TABLET | Freq: Two times a day (BID) | ORAL | Status: DC
  Administered 2023-08-11 – 2023-08-14 (×6): 3.125 mg via ORAL
  Filled 2023-08-11 (×6): qty 1

## 2023-08-11 MED ORDER — ALUM & MAG HYDROXIDE-SIMETH 200-200-20 MG/5ML PO SUSP
30.0000 mL | Freq: Once | ORAL | Status: AC
  Administered 2023-08-11: 30 mL via ORAL
  Filled 2023-08-11: qty 30

## 2023-08-11 MED ORDER — ACETAMINOPHEN 325 MG PO TABS: 650.0000 mg | ORAL_TABLET | Freq: Four times a day (QID) | ORAL | Status: DC | PRN

## 2023-08-11 MED ORDER — AMLODIPINE BESYLATE 5 MG PO TABS
5.0000 mg | ORAL_TABLET | Freq: Every day | ORAL | Status: DC
  Administered 2023-08-11: 5 mg via ORAL
  Filled 2023-08-11: qty 1

## 2023-08-11 MED ORDER — ONDANSETRON HCL 4 MG/2ML IJ SOLN: 4.0000 mg | Freq: Four times a day (QID) | INTRAMUSCULAR | Status: DC | PRN

## 2023-08-11 MED ORDER — NITROGLYCERIN 0.4 MG SL SUBL
0.4000 mg | SUBLINGUAL_TABLET | SUBLINGUAL | Status: DC | PRN
  Administered 2023-08-12 (×2): 0.4 mg via SUBLINGUAL
  Filled 2023-08-11: qty 1

## 2023-08-11 MED ORDER — ASPIRIN 81 MG PO CHEW
324.0000 mg | CHEWABLE_TABLET | Freq: Once | ORAL | Status: AC
  Administered 2023-08-11: 324 mg via ORAL
  Filled 2023-08-11: qty 4

## 2023-08-11 MED ORDER — ACETAMINOPHEN 650 MG RE SUPP: 650.0000 mg | Freq: Four times a day (QID) | RECTAL | Status: DC | PRN

## 2023-08-11 MED ORDER — HYDRALAZINE HCL 20 MG/ML IJ SOLN
10.0000 mg | INTRAMUSCULAR | Status: DC | PRN
  Administered 2023-08-12 (×2): 10 mg via INTRAVENOUS
  Filled 2023-08-11 (×2): qty 1

## 2023-08-11 MED ORDER — PERFLUTREN LIPID MICROSPHERE
1.0000 mL | INTRAVENOUS | Status: AC | PRN
  Administered 2023-08-11: 3 mL via INTRAVENOUS

## 2023-08-11 NOTE — ED Provider Notes (Signed)
 [place] EMERGENCY DEPARTMENT AT [facility] Provider Note   CSN: [id number] Arrival date & time: 08/11/23  9164     Patient presents with: Chest Pain   Christian Ali is a 48 y.o. male.   Patient is a 48 year old male who presents emergency department the chief complaint of substernal chest pain which has been ongoing for approximately the last week.  He notes it did become worse today.  He notes that he was exerting himself when the pain became worse.  He does admit to ongoing pain in the emergency department.  He notes he is a smoker but denies any personal history of hypertension, hyperlipidemia, diabetes.  He does have a family history of CAD.  Patient notes that he has had no associate abdominal pain, nausea, vomiting, diarrhea.  He does admit to some associated shortness of breath.  He has had no associate lower extremity edema, hemoptysis.   Chest Pain      Prior to Admission medications   Medication Sig Start Date End Date Taking? Authorizing Provider  cetirizine  (ZYRTEC  ALLERGY) 10 MG tablet Take 1 tablet (10 mg total) by mouth daily. 09/10/20   Avegno, Komlanvi S, FNP  doxycycline  (VIBRAMYCIN ) 100 MG capsule Take 1 capsule (100 mg total) by mouth 2 (two) times daily. 06/28/21   Leath-Warren, Etta PARAS, NP  HYDROcodone -acetaminophen  (NORCO/VICODIN) 5-325 MG tablet Take 1 tablet by mouth every 4 (four) hours as needed. 02/06/20   Henderly, Britni A, PA-C  hydrocortisone cream 1 % Apply 1 application topically once as needed for itching.    [provider]  lidocaine  (LIDODERM ) 5 % Place 1 patch onto the skin daily. Remove & Discard patch within 12 hours or as directed by MD 02/06/20   Henderly, Britni A, PA-C  methocarbamol  (ROBAXIN ) 500 MG tablet Take 1 tablet (500 mg total) by mouth 2 (two) times daily. 02/06/20   Henderly, Britni A, PA-C  naproxen  (NAPROSYN ) 500 MG tablet Take 1 tablet (500 mg total) by mouth 2 (two) times daily. 02/19/22   Beatty,  Celeste A, PA-C  predniSONE  (STERAPRED UNI-PAK 21 TAB) 10 MG (21) TBPK tablet Take by mouth daily. Take 6 tabs by mouth daily  for 1 days, then 5 tabs for 1 days, then 4 tabs for 1 days, then 3 tabs for 1 days, 2 tabs for 1 days, then 1 tab by mouth daily for 1 days 09/10/20   Avegno, Komlanvi S, FNP  triamcinolone  cream (KENALOG ) 0.1 % Apply 1 application topically 2 (two) times daily. 05/31/19   Cleotilde Rogue, MD    Allergies: Patient has no known allergies.    Review of Systems  Cardiovascular:  Positive for chest pain.  All other systems reviewed and are negative.   Updated Vital Signs BP (!) 158/111   Pulse 62   Temp 98.7 F (37.1 C) (Oral)   Resp 17   Ht 5' 5 (1.651 m)   Wt 62.1 kg   SpO2 97%   BMI 22.80 kg/m   Physical Exam Vitals and nursing note reviewed.  Constitutional:      Appearance: Normal appearance.  HENT:     Head: Normocephalic and atraumatic.     Nose: Nose normal.     Mouth/Throat:     Mouth: Mucous membranes are moist.   Eyes:     Extraocular Movements: Extraocular movements intact.     Conjunctiva/sclera: Conjunctivae normal.     Pupils: Pupils are equal, round, and reactive to light.  Cardiovascular:     Rate and Rhythm: Normal rate and regular rhythm.     Pulses: Normal pulses.     Heart sounds: Normal heart sounds. Heart sounds not distant. No murmur heard. Pulmonary:     Effort: Pulmonary effort is normal. No tachypnea.     Breath sounds: Normal breath sounds. No decreased breath sounds, wheezing, rhonchi or rales.  Chest:     Chest wall: No tenderness.  Abdominal:     General: Abdomen is flat. Bowel sounds are normal.     Palpations: Abdomen is soft. There is no mass.     Tenderness: There is no abdominal tenderness.   Musculoskeletal:        General: Normal range of motion.     Cervical back: Normal range of motion and neck supple.   Skin:    General: Skin is warm and dry.   Neurological:     General: No focal deficit  present.     Mental Status: He is alert and oriented to person, place, and time. Mental status is at baseline.   Psychiatric:        Mood and Affect: Mood normal.        Behavior: Behavior normal.        Thought Content: Thought content normal.        Judgment: Judgment normal.     (all labs ordered are listed, but only abnormal results are displayed) Labs Reviewed  COMPREHENSIVE METABOLIC PANEL WITH GFR - Abnormal; Notable for the following components:      Result Value   Calcium 8.6 (*)    All other components within normal limits  CBC WITH DIFFERENTIAL/PLATELET - Abnormal; Notable for the following components:   WBC 3.0 (*)    Neutro Abs 1.2 (*)    All other components within normal limits  TROPONIN I (HIGH SENSITIVITY)  TROPONIN I (HIGH SENSITIVITY)    EKG: EKG Interpretation Date/Time:  Monday August 11 2023 08:48:30 EDT Ventricular Rate:  100 PR Interval:  120 QRS Duration:  95 QT Interval:  371 QTC Calculation: 365 R Axis:   76  Text Interpretation: Incomplete analysis due to missing data in precordial lead(s) Sinus tachycardia Ventricular bigeminy Left ventricular hypertrophy Abnormal T, consider ischemia, diffuse leads Missing lead(s): V6 Confirmed by Melvenia Motto [phone]) on 08/11/2023 8:51:23 AM  Radiology: [facility] Chest Port 1 View Result Date: 08/11/2023 CLINICAL DATA:  Chest pain. EXAM: PORTABLE CHEST 1 VIEW COMPARISON:  02/06/2020 FINDINGS: Lungs are hyperexpanded. Cardiopericardial silhouette is at upper limits of normal for size. The lungs are clear without focal pneumonia, edema, pneumothorax or pleural effusion. No acute bony abnormality. Telemetry leads overlie the chest. IMPRESSION: Hyperexpansion without acute cardiopulmonary findings. Electronically Signed   By: Camellia Candle M.D.   On: 08/11/2023 09:11     Procedures   Medications Ordered in the ED  nitroGLYCERIN (NITROSTAT) SL tablet 0.4 mg (has no administration in time range)  alum & mag hydroxide-simeth  (MAALOX/MYLANTA) 200-200-20 MG/5ML suspension 30 mL (has no administration in time range)  amLODipine (NORVASC) tablet 5 mg (has no administration in time range)  aspirin chewable tablet 324 mg (324 mg Oral Given 08/11/23 9078)                                    Medical Decision Making Amount and/or Complexity of Data Reviewed Labs: ordered. Radiology: ordered.  Risk OTC drugs. Prescription drug management.  Decision regarding hospitalization.   This patient presents to the ED for concern of chest pain, this involves an extensive number of treatment options, and is a complaint that carries with it a high risk of complications and morbidity.  The differential diagnosis includes ACS, pulmonary embolus, pericarditis, myocarditis, endocarditis, pneumonia,   Co morbidities that complicate the patient evaluation  Smoker   Additional history obtained:  Additional history obtained from none External records from outside source obtained and reviewed including none   Lab Tests:  I Ordered, and personally interpreted labs.  The pertinent results include: Leukopenia, no anemia, normal electrolytes, normal kidney function liver function, negative serial troponins   Imaging Studies ordered:  I ordered imaging studies including chest x-ray I independently visualized and interpreted imaging which showed no acute cardiopulmonary process I agree with the radiologist interpretation   Cardiac Monitoring: / EKG:  The patient was maintained on a cardiac monitor.  I personally viewed and interpreted the cardiac monitored which showed an underlying rhythm of: Normal sinus rhythm, T wave inversions in inferior lateral leads, no ST changes, no STEMI   Consultations Obtained:  I requested consultation with the cardiology Dr. Alvan,  and discussed lab and imaging findings as well as pertinent plan - they recommend: Admission   Problem List / ED Course / Critical interventions / Medication  management  Patient is doing well at this time and does remain stable.  Chest pain has improved in the emergency department.  Did discuss patient case with Dr. Alvan with cardiology who has fully reviewed the patient and recommended admission for chest pain rule out.  Patient blood work has been overall unremarkable.  EKG has T wave inversions in inferior and lateral leads.  Serial troponins have been negative.  Patient does have a family history of cardiac disease as well as a history of smoking.  Suspect his underlying hyper engine that he is untreated for.  Have discussed patient case with Dr. Maree with the hospital service who has excepted for admission at this time. I ordered medication including nitro, aspirin for chest pain Reevaluation of the patient after these medicines showed that the patient improved I have reviewed the patients home medicines and have made adjustments as needed   Social Determinants of Health:  None   Test / Admission - Considered:  Admission     Final diagnoses:  Chest pain, unspecified type    ED Discharge Orders     None          Daralene Lonni JONETTA DEVONNA 08/11/23 1251    Melvenia Motto, MD 08/11/23 1728

## 2023-08-11 NOTE — H&P (Signed)
 History and Physical    Christian Ali FMW:[id number] DOB: 07-Sep-1975 DOA: 08/11/2023  PCP: Patient, No Pcp Per   Patient coming from: Home  Chief Complaint: Chest pain  HPI: Christian Ali is a 48 y.o. male with no significant past medical history, but does not see physicians regularly who presented to the ED with complaints of chest pain that has been ongoing for the last 1 week.  It has become worse today and was initially noted to only occur with exertion, but now appears to be occurring a little bit with rest as well.  He has noted to have some associated shortness of breath.  He denies any orthopnea, lower extremity edema, cough, fevers, chills, or hemoptysis.  He does appear to have family history of CAD and does smoke.   ED Course: Vital signs with elevated blood pressure readings, but otherwise stable.  Laboratory data unremarkable and troponins at 7 and 9 on repeat.  EKG with no findings of ischemia.  Chest x-ray with no acute process.  Patient seen by cardiology with recommendations to admit for echocardiogram and possible stress testing in a.m.  Review of Systems: Reviewed as noted above, otherwise negative.  Past Medical History:  Diagnosis Date   H/O shoulder surgery     Past Surgical History:  Procedure Laterality Date   SHOULDER SURGERY       reports that he has been smoking cigarettes. He has never used smokeless tobacco. He reports current alcohol use. He reports current drug use. Drug: Marijuana.  No Known Allergies  History reviewed. No pertinent family history.  Prior to Admission medications   Medication Sig Start Date End Date Taking? Authorizing Provider  cetirizine  (ZYRTEC  ALLERGY) 10 MG tablet Take 1 tablet (10 mg total) by mouth daily. 09/10/20   Avegno, Komlanvi S, FNP  doxycycline  (VIBRAMYCIN ) 100 MG capsule Take 1 capsule (100 mg total) by mouth 2 (two) times daily. 06/28/21   Leath-Warren, Etta PARAS, NP  HYDROcodone -acetaminophen  (NORCO/VICODIN)  5-325 MG tablet Take 1 tablet by mouth every 4 (four) hours as needed. 02/06/20   Henderly, Britni A, PA-C  hydrocortisone cream 1 % Apply 1 application topically once as needed for itching.    [provider]  lidocaine  (LIDODERM ) 5 % Place 1 patch onto the skin daily. Remove & Discard patch within 12 hours or as directed by MD 02/06/20   Henderly, Britni A, PA-C  methocarbamol  (ROBAXIN ) 500 MG tablet Take 1 tablet (500 mg total) by mouth 2 (two) times daily. 02/06/20   Henderly, Britni A, PA-C  naproxen  (NAPROSYN ) 500 MG tablet Take 1 tablet (500 mg total) by mouth 2 (two) times daily. 02/19/22   Beatty, Celeste A, PA-C  predniSONE  (STERAPRED UNI-PAK 21 TAB) 10 MG (21) TBPK tablet Take by mouth daily. Take 6 tabs by mouth daily  for 1 days, then 5 tabs for 1 days, then 4 tabs for 1 days, then 3 tabs for 1 days, 2 tabs for 1 days, then 1 tab by mouth daily for 1 days 09/10/20   Avegno, Komlanvi S, FNP  triamcinolone  cream (KENALOG ) 0.1 % Apply 1 application topically 2 (two) times daily. 05/31/19   Cleotilde Rogue, MD    Physical Exam: Vitals:   08/11/23 0930 08/11/23 1000 08/11/23 1030 08/11/23 1330  BP: (!) 168/111 (!) 163/106 (!) 158/111   Pulse: 76 72 62   Resp: 14 13 17    Temp:    98.3 F (36.8 C)  TempSrc:    Oral  SpO2: 97% 98% 97%   Weight:      Height:        Constitutional: NAD, calm, comfortable Vitals:   08/11/23 0930 08/11/23 1000 08/11/23 1030 08/11/23 1330  BP: (!) 168/111 (!) 163/106 (!) 158/111   Pulse: 76 72 62   Resp: 14 13 17    Temp:    98.3 F (36.8 C)  TempSrc:    Oral  SpO2: 97% 98% 97%   Weight:      Height:       Eyes: lids and conjunctivae normal Neck: normal, supple Respiratory: clear to auscultation bilaterally. Normal respiratory effort. No accessory muscle use.  Cardiovascular: Regular rate and rhythm, no murmurs. Abdomen: no tenderness, no distention. Bowel sounds positive.  Musculoskeletal:  No edema. Skin: no rashes, lesions, ulcers.   Psychiatric: Flat affect  Labs on Admission: I have personally reviewed following labs and imaging studies  CBC: Recent Labs  Lab 08/11/23 0922  WBC 3.0*  NEUTROABS 1.2*  HGB 14.4  HCT 42.8  MCV 96.6  PLT 187   Basic Metabolic Panel: Recent Labs  Lab 08/11/23 0922  NA 139  K 3.7  CL 105  CO2 23  GLUCOSE 87  BUN 16  CREATININE 0.85  CALCIUM 8.6*   GFR: Estimated Creatinine Clearance: 92.5 mL/min (by C-G formula based on SCr of 0.85 mg/dL). Liver Function Tests: Recent Labs  Lab 08/11/23 0922  AST 30  ALT 22  ALKPHOS 69  BILITOT 0.3  PROT 7.0  ALBUMIN 4.2   No results for input(s): LIPASE, AMYLASE in the last 168 hours. No results for input(s): AMMONIA in the last 168 hours. Coagulation Profile: No results for input(s): INR, PROTIME in the last 168 hours. Cardiac Enzymes: No results for input(s): CKTOTAL, CKMB, CKMBINDEX, TROPONINI in the last 168 hours. BNP (last 3 results) No results for input(s): PROBNP in the last 8760 hours. HbA1C: No results for input(s): HGBA1C in the last 72 hours. CBG: No results for input(s): GLUCAP in the last 168 hours. Lipid Profile: No results for input(s): CHOL, HDL, LDLCALC, TRIG, CHOLHDL, LDLDIRECT in the last 72 hours. Thyroid Function Tests: No results for input(s): TSH, T4TOTAL, FREET4, T3FREE, THYROIDAB in the last 72 hours. Anemia Panel: No results for input(s): VITAMINB12, FOLATE, FERRITIN, TIBC, IRON, RETICCTPCT in the last 72 hours. Urine analysis: No results found for: COLORURINE, APPEARANCEUR, LABSPEC, PHURINE, GLUCOSEU, HGBUR, BILIRUBINUR, KETONESUR, PROTEINUR, UROBILINOGEN, NITRITE, LEUKOCYTESUR  Radiological Exams on Admission: ECHOCARDIOGRAM COMPLETE Result Date: 08/11/2023    ECHOCARDIOGRAM REPORT   Patient Name:   Christian Ali [facility] Date of Exam: 08/11/2023 Medical Rec #:  [id number]         Height:       65.0 in Accession #:     [id number]        Weight:       137.0 lb Date of Birth:  1976/01/18         BSA:          1.684 m Patient Age:    48 years          BP:           158/111 mmHg Patient Gender: M                 HR:           62 bpm. Exam Location:  [facility] Procedure: 2D Echo, 3D Echo, Cardiac Doppler, Color Doppler and Intracardiac  Opacification Agent (Both Spectral and Color Flow Doppler were            utilized during procedure). Indications:    Chest Pain R07.9  History:        Patient has no prior history of Echocardiogram examinations.  Sonographer:    Aida Pizza RCS Referring Phys: [id number] DORN FALCON BRANCH IMPRESSIONS  1. Left ventricular ejection fraction, by estimation, is 25 to 30%. The left ventricle has severely decreased function. The left ventricle demonstrates global hypokinesis. The left ventricular internal cavity size was moderately dilated. Left ventricular diastolic parameters are consistent with Grade I diastolic dysfunction (impaired relaxation).  2. Right ventricular systolic function is normal. The right ventricular size is normal. Tricuspid regurgitation signal is inadequate for assessing PA pressure.  3. The mitral valve is normal in structure. Trivial mitral valve regurgitation. No evidence of mitral stenosis.  4. The aortic valve is tricuspid. Aortic valve regurgitation is not visualized. No aortic stenosis is present.  5. The inferior vena cava is normal in size with greater than 50% respiratory variability, suggesting right atrial pressure of 3 mmHg. FINDINGS  Left Ventricle: Left ventricular ejection fraction, by estimation, is 25 to 30%. The left ventricle has severely decreased function. The left ventricle demonstrates global hypokinesis. Definity contrast agent was given IV to delineate the left ventricular endocardial borders. The left ventricular internal cavity size was moderately dilated. There is no left ventricular hypertrophy. Left ventricular diastolic parameters are  consistent with Grade I diastolic dysfunction (impaired relaxation). Normal left ventricular filling pressure. Right Ventricle: The right ventricular size is normal. Right vetricular wall thickness was not well visualized. Right ventricular systolic function is normal. Tricuspid regurgitation signal is inadequate for assessing PA pressure. Left Atrium: Left atrial size was normal in size. Right Atrium: Right atrial size was normal in size. Pericardium: There is no evidence of pericardial effusion. Mitral Valve: The mitral valve is normal in structure. Trivial mitral valve regurgitation. No evidence of mitral valve stenosis. Tricuspid Valve: The tricuspid valve is normal in structure. Tricuspid valve regurgitation is trivial. No evidence of tricuspid stenosis. Aortic Valve: The aortic valve is tricuspid. Aortic valve regurgitation is not visualized. No aortic stenosis is present. Aortic valve mean gradient measures 4.3 mmHg. Aortic valve peak gradient measures 8.4 mmHg. Aortic valve area, by VTI measures 1.78 cm. Pulmonic Valve: The pulmonic valve was not well visualized. Pulmonic valve regurgitation is not visualized. No evidence of pulmonic stenosis. Aorta: The aortic root is normal in size and structure. Venous: The inferior vena cava is normal in size with greater than 50% respiratory variability, suggesting right atrial pressure of 3 mmHg. IAS/Shunts: No atrial level shunt detected by color flow Doppler. Additional Comments: 3D was performed not requiring image post processing on an independent workstation and was abnormal.  LEFT VENTRICLE PLAX 2D LVIDd:         6.40 cm   Diastology LVIDs:         5.40 cm   LV e' medial:    6.42 cm/s LV PW:         1.00 cm   LV E/e' medial:  7.7 LV IVS:        0.90 cm   LV e' lateral:   7.83 cm/s LVOT diam:     2.00 cm   LV E/e' lateral: 6.3 LV SV:         45 LV SV Index:   27 LVOT Area:     3.14 cm  3D Volume EF:                          3D EF:         44 %                          LV EDV:       194 ml                          LV ESV:       109 ml                          LV SV:        85 ml RIGHT VENTRICLE RV S prime:     13.30 cm/s TAPSE (M-mode): 1.8 cm LEFT ATRIUM             Index        RIGHT ATRIUM           Index LA diam:        3.10 cm 1.84 cm/m   RA Area:     12.00 cm LA Vol (A2C):   48.1 ml 28.56 ml/m  RA Volume:   25.90 ml  15.38 ml/m LA Vol (A4C):   53.3 ml 31.64 ml/m LA Biplane Vol: 53.6 ml 31.82 ml/m  AORTIC VALVE AV Area (Vmax):    1.71 cm AV Area (Vmean):   1.64 cm AV Area (VTI):     1.78 cm AV Vmax:           144.76 cm/s AV Vmean:          96.812 cm/s AV VTI:            0.252 m AV Peak Grad:      8.4 mmHg AV Mean Grad:      4.3 mmHg LVOT Vmax:         79.00 cm/s LVOT Vmean:        50.500 cm/s LVOT VTI:          0.143 m LVOT/AV VTI ratio: 0.57  AORTA Ao Root diam: 3.50 cm MITRAL VALVE MV Area (PHT): 3.27 cm    SHUNTS MV Decel Time: 232 msec    Systemic VTI:  0.14 m MV E velocity: 49.30 cm/s  Systemic Diam: 2.00 cm MV A velocity: 89.00 cm/s MV E/A ratio:  0.55 Dorn Ross MD Electronically signed by Dorn Ross MD Signature Date/Time: 08/11/2023/1:46:21 PM    Final    DG Chest Port 1 View Result Date: 08/11/2023 CLINICAL DATA:  Chest pain. EXAM: PORTABLE CHEST 1 VIEW COMPARISON:  02/06/2020 FINDINGS: Lungs are hyperexpanded. Cardiopericardial silhouette is at upper limits of normal for size. The lungs are clear without focal pneumonia, edema, pneumothorax or pleural effusion. No acute bony abnormality. Telemetry leads overlie the chest. IMPRESSION: Hyperexpansion without acute cardiopulmonary findings. Electronically Signed   By: Camellia Candle M.D.   On: 08/11/2023 09:11    EKG: Independently reviewed.  SR with [facility]  Assessment/Plan Principal Problem:   Chest pain    Atypical chest pain possibly GI related - No troponin elevation or EKG changes suggestive of ischemia - Trial of Maalox - Follow-up echocardiogram -  N.p.o. after midnight for possible stress testing in a.m. - Appreciate cardiology evaluation  Hypertension - No formal diagnosis, started on Norvasc per cardiology -  IV hydralazine as needed for significant elevations  Tobacco abuse -Counseled on cessation  DVT prophylaxis: Lovenox Code Status: Full Family Communication: Multiple at bedside Disposition Plan: Admitted for cardiac evaluation with possible stress test Consults called: Cardiology Admission status: Observation, telemetry  Severity of Illness: The appropriate patient status for this patient is OBSERVATION. Observation status is judged to be reasonable and necessary in order to provide the required intensity of service to ensure the patient's safety. The patient's presenting symptoms, physical exam findings, and initial radiographic and laboratory data in the context of their medical condition is felt to place them at decreased risk for further clinical deterioration. Furthermore, it is anticipated that the patient will be medically stable for discharge from the hospital within 2 midnights of admission.    Jaquisha Frech D Maree DO [facility]  If 7PM-7AM, please contact night-coverage [email or web address]  08/11/2023, 1:48 PM

## 2023-08-11 NOTE — ED Triage Notes (Signed)
 Pt has had chest pain for a bout 1 week today its worse. Came to get checked.

## 2023-08-11 NOTE — Progress Notes (Signed)
 Update: Patient has undergone 2D echocardiogram with global hypokinesis and LVEF 25-30%.  Medications have been adjusted per cardiology and he will require catheterization and therefore transferred to [facility] for further evaluation.

## 2023-08-11 NOTE — ED Notes (Signed)
 Called dept 300 twice to give a report for pt with no answer. Will attempt again shortly or contact [facility]OT.

## 2023-08-11 NOTE — H&P (View-Only) (Signed)
 Cardiology Consultation   Patient ID: Christian Ali MRN: [id number]; DOB: 06/22/1975  Admit date: 08/11/2023 Date of Consult: 08/11/2023  PCP:  Patient, No Pcp Per   [facility] Providers Cardiologist: New   Patient Profile: Christian Ali is a 48 y.o. male with no significant PMH but does not see physicians regularly who is being seen 08/11/2023 for the evaluation of epigastric/chest pain at the request of PA Rigney.  History of Present Illness: Christian Ali 48 yo male no significant PMH but does not see physicians regularly presents with epigastric/chest pain.    K 3.7 BUN 16 Cr 0.85 WBC 3 Hgb 14.4 Plt 187  Trop 7-->9 EKG SR, LVH with strain pattern that are chronic CXR: no acute process  Past Medical History:  Diagnosis Date   H/O shoulder surgery     Past Surgical History:  Procedure Laterality Date   SHOULDER SURGERY       Scheduled Meds:  Continuous Infusions:  PRN Meds: nitroGLYCERIN  Allergies:   No Known Allergies  Social History:   Social History   Socioeconomic History   Marital status: Single    Spouse name: Not on file   Number of children: Not on file   Years of education: Not on file   Highest education level: Not on file  Occupational History   Not on file  Tobacco Use   Smoking status: Every Day    Current packs/day: 1.00    Types: Cigarettes   Smokeless tobacco: Never  Substance and Sexual Activity   Alcohol use: Yes    Comment: occasionally   Drug use: Yes    Types: Marijuana    Comment: occ   Sexual activity: Yes  Other Topics Concern   Not on file  Social History Narrative   Not on file   Social Drivers of Health   Financial Resource Strain: Not on file  Food Insecurity: Not on file  Transportation Needs: Not on file  Physical Activity: Not on file  Stress: Not on file  Social Connections: Not on file  Intimate Partner Violence: Not on file    Family History:   History reviewed. No pertinent  family history.   ROS:  Please see the history of present illness.   All other ROS reviewed and negative.     Physical Exam/Data: Vitals:   08/11/23 0910 08/11/23 0912 08/11/23 0915  BP: (!) 163/122    Pulse: 64    Temp:   98.7 F (37.1 C)  TempSrc:   Oral  SpO2: 98%    Weight:  62.1 kg   Height:  5' 5 (1.651 m)    No intake or output data in the 24 hours ending 08/11/23 1109    08/11/2023    9:12 AM 02/19/2022    5:56 AM 02/06/2020   11:38 AM  Last 3 Weights  Weight (lbs) 137 lb 140 lb 140 lb  Weight (kg) 62.143 kg 63.504 kg 63.504 kg     Body mass index is 22.8 kg/m.  General:  Well nourished, well developed, in no acute distress HEENT: normal Neck: no JVD Vascular: No carotid bruits; Distal pulses 2+ bilaterally Cardiac:  normal S1, S2; RRR; no murmur  Lungs:  clear to auscultation bilaterally, no wheezing, rhonchi or rales  Abd: soft, nontender, no hepatomegaly  Ext: no edema Musculoskeletal:  No deformities, BUE and BLE strength normal and equal Skin: warm and dry  Neuro:  CNs 2-12 intact, no focal abnormalities noted Psych:  Normal affect     Laboratory Data: High Sensitivity Troponin:   Recent Labs  Lab 08/11/23 0922  TROPONINIHS 7     Chemistry Recent Labs  Lab 08/11/23 0922  NA 139  K 3.7  CL 105  CO2 23  GLUCOSE 87  BUN 16  CREATININE 0.85  CALCIUM 8.6*  GFRNONAA >60  ANIONGAP 11    Recent Labs  Lab 08/11/23 0922  PROT 7.0  ALBUMIN 4.2  AST 30  ALT 22  ALKPHOS 69  BILITOT 0.3   Lipids No results for input(s): CHOL, TRIG, HDL, LABVLDL, LDLCALC, CHOLHDL in the last 168 hours.  Hematology Recent Labs  Lab 08/11/23 0922  WBC 3.0*  RBC 4.43  HGB 14.4  HCT 42.8  MCV 96.6  MCH 32.5  MCHC 33.6  RDW 12.8  PLT 187   Thyroid No results for input(s): TSH, FREET4 in the last 168 hours.  BNPNo results for input(s): BNP, PROBNP in the last 168 hours.  DDimer No results for input(s): DDIMER in the last 168  hours.  Radiology/Studies:  DG Chest Port 1 View Result Date: 08/11/2023 CLINICAL DATA:  Chest pain. EXAM: PORTABLE CHEST 1 VIEW COMPARISON:  02/06/2020 FINDINGS: Lungs are hyperexpanded. Cardiopericardial silhouette is at upper limits of normal for size. The lungs are clear without focal pneumonia, edema, pneumothorax or pleural effusion. No acute bony abnormality. Telemetry leads overlie the chest. IMPRESSION: Hyperexpansion without acute cardiopulmonary findings. Electronically Signed   By: Camellia Candle M.D.   On: 08/11/2023 09:11     Assessment and Plan: 1.Epigastric/chest pain - some atypical features. Occurs at rest or with exertion. Today symptoms have been constant 7 hours.  - no objective evidence of ischemia by EKG or enzymes thus far. EKG with chronic LVH and strain pattern - f/u echo. Trial of oral maalox emperically - would make NPO tonight for possible stress testing tomorrow AM    2. HTN - does not have formal diagnosis as he does not have an outpatient phsyician - significant HTN on presentation, significant LVH on EKG.  - start norvasc 5mg  with plans to titrate and add additional agents as neccesary.  - f/u up echo for any related HTN changes   Addendum 147pm Echo with global hypokinesis, LVEF 25-30%. Unclear etiology of his cardiomyopathy. High bp and LVH on EKG suspect possible hypertensive CM however no significant LVH by echo. Denies any heavy EtOH or drug use other than marijuana, no significant family history of heart disease. No recent viral infections. Chest pain remains fairly atypical without objective evidence of ischemia thus far but based on echo will require cath.  Had already ready started norvasc prior to echo results, will d/c. Will add losartan 25mg , if tolerates transition to entresto. Add coreg 3.125mg  bid.  SGLT2i and MRA later in admission.  - since likely starting aldactone soon go ahead and check renin/aldo for his aggressive HTN. Pending bp  response consider further workup for secondary hypertension.  - if benign cath consider cMRI - does not appear volume overloaded.    Informed Consent   Shared Decision Making/Informed Consent The risks [stroke (1 in 1000), death (1 in 1000), kidney failure [usually temporary] (1 in 500), bleeding (1 in 200), allergic reaction [possibly serious] (1 in 200)], benefits (diagnostic support and management of coronary artery disease) and alternatives of a cardiac catheterization were discussed in detail with Mr. Cummiskey and he is willing to proceed.        For questions or updates, please  contact [facility] Please consult [email or web address] for contact info under    Signed, Alvan Carrier, MD  08/11/2023 11:09 AM

## 2023-08-11 NOTE — Consult Note (Addendum)
 Cardiology Consultation   Patient ID: Christian Ali MRN: [id number]; DOB: 06/22/1975  Admit date: 08/11/2023 Date of Consult: 08/11/2023  PCP:  Patient, No Pcp Per   [facility] Providers Cardiologist: New   Patient Profile: Christian Ali is a 48 y.o. male with no significant PMH but does not see physicians regularly who is being seen 08/11/2023 for the evaluation of epigastric/chest pain at the request of PA Rigney.  History of Present Illness: Christian Ali 48 yo male no significant PMH but does not see physicians regularly presents with epigastric/chest pain.    K 3.7 BUN 16 Cr 0.85 WBC 3 Hgb 14.4 Plt 187  Trop 7-->9 EKG SR, LVH with strain pattern that are chronic CXR: no acute process  Past Medical History:  Diagnosis Date   H/O shoulder surgery     Past Surgical History:  Procedure Laterality Date   SHOULDER SURGERY       Scheduled Meds:  Continuous Infusions:  PRN Meds: nitroGLYCERIN  Allergies:   No Known Allergies  Social History:   Social History   Socioeconomic History   Marital status: Single    Spouse name: Not on file   Number of children: Not on file   Years of education: Not on file   Highest education level: Not on file  Occupational History   Not on file  Tobacco Use   Smoking status: Every Day    Current packs/day: 1.00    Types: Cigarettes   Smokeless tobacco: Never  Substance and Sexual Activity   Alcohol use: Yes    Comment: occasionally   Drug use: Yes    Types: Marijuana    Comment: occ   Sexual activity: Yes  Other Topics Concern   Not on file  Social History Narrative   Not on file   Social Drivers of Health   Financial Resource Strain: Not on file  Food Insecurity: Not on file  Transportation Needs: Not on file  Physical Activity: Not on file  Stress: Not on file  Social Connections: Not on file  Intimate Partner Violence: Not on file    Family History:   History reviewed. No pertinent  family history.   ROS:  Please see the history of present illness.   All other ROS reviewed and negative.     Physical Exam/Data: Vitals:   08/11/23 0910 08/11/23 0912 08/11/23 0915  BP: (!) 163/122    Pulse: 64    Temp:   98.7 F (37.1 C)  TempSrc:   Oral  SpO2: 98%    Weight:  62.1 kg   Height:  5' 5 (1.651 m)    No intake or output data in the 24 hours ending 08/11/23 1109    08/11/2023    9:12 AM 02/19/2022    5:56 AM 02/06/2020   11:38 AM  Last 3 Weights  Weight (lbs) 137 lb 140 lb 140 lb  Weight (kg) 62.143 kg 63.504 kg 63.504 kg     Body mass index is 22.8 kg/m.  General:  Well nourished, well developed, in no acute distress HEENT: normal Neck: no JVD Vascular: No carotid bruits; Distal pulses 2+ bilaterally Cardiac:  normal S1, S2; RRR; no murmur  Lungs:  clear to auscultation bilaterally, no wheezing, rhonchi or rales  Abd: soft, nontender, no hepatomegaly  Ext: no edema Musculoskeletal:  No deformities, BUE and BLE strength normal and equal Skin: warm and dry  Neuro:  CNs 2-12 intact, no focal abnormalities noted Psych:  Normal affect     Laboratory Data: High Sensitivity Troponin:   Recent Labs  Lab 08/11/23 0922  TROPONINIHS 7     Chemistry Recent Labs  Lab 08/11/23 0922  NA 139  K 3.7  CL 105  CO2 23  GLUCOSE 87  BUN 16  CREATININE 0.85  CALCIUM 8.6*  GFRNONAA >60  ANIONGAP 11    Recent Labs  Lab 08/11/23 0922  PROT 7.0  ALBUMIN 4.2  AST 30  ALT 22  ALKPHOS 69  BILITOT 0.3   Lipids No results for input(s): CHOL, TRIG, HDL, LABVLDL, LDLCALC, CHOLHDL in the last 168 hours.  Hematology Recent Labs  Lab 08/11/23 0922  WBC 3.0*  RBC 4.43  HGB 14.4  HCT 42.8  MCV 96.6  MCH 32.5  MCHC 33.6  RDW 12.8  PLT 187   Thyroid No results for input(s): TSH, FREET4 in the last 168 hours.  BNPNo results for input(s): BNP, PROBNP in the last 168 hours.  DDimer No results for input(s): DDIMER in the last 168  hours.  Radiology/Studies:  DG Chest Port 1 View Result Date: 08/11/2023 CLINICAL DATA:  Chest pain. EXAM: PORTABLE CHEST 1 VIEW COMPARISON:  02/06/2020 FINDINGS: Lungs are hyperexpanded. Cardiopericardial silhouette is at upper limits of normal for size. The lungs are clear without focal pneumonia, edema, pneumothorax or pleural effusion. No acute bony abnormality. Telemetry leads overlie the chest. IMPRESSION: Hyperexpansion without acute cardiopulmonary findings. Electronically Signed   By: Camellia Candle M.D.   On: 08/11/2023 09:11     Assessment and Plan: 1.Epigastric/chest pain - some atypical features. Occurs at rest or with exertion. Today symptoms have been constant 7 hours.  - no objective evidence of ischemia by EKG or enzymes thus far. EKG with chronic LVH and strain pattern - f/u echo. Trial of oral maalox emperically - would make NPO tonight for possible stress testing tomorrow AM    2. HTN - does not have formal diagnosis as he does not have an outpatient phsyician - significant HTN on presentation, significant LVH on EKG.  - start norvasc 5mg  with plans to titrate and add additional agents as neccesary.  - f/u up echo for any related HTN changes   Addendum 147pm Echo with global hypokinesis, LVEF 25-30%. Unclear etiology of his cardiomyopathy. High bp and LVH on EKG suspect possible hypertensive CM however no significant LVH by echo. Denies any heavy EtOH or drug use other than marijuana, no significant family history of heart disease. No recent viral infections. Chest pain remains fairly atypical without objective evidence of ischemia thus far but based on echo will require cath.  Had already ready started norvasc prior to echo results, will d/c. Will add losartan 25mg , if tolerates transition to entresto. Add coreg 3.125mg  bid.  SGLT2i and MRA later in admission.  - since likely starting aldactone soon go ahead and check renin/aldo for his aggressive HTN. Pending bp  response consider further workup for secondary hypertension.  - if benign cath consider cMRI - does not appear volume overloaded.    Informed Consent   Shared Decision Making/Informed Consent The risks [stroke (1 in 1000), death (1 in 1000), kidney failure [usually temporary] (1 in 500), bleeding (1 in 200), allergic reaction [possibly serious] (1 in 200)], benefits (diagnostic support and management of coronary artery disease) and alternatives of a cardiac catheterization were discussed in detail with Christian Ali and he is willing to proceed.        For questions or updates, please  contact [facility] Please consult [email or web address] for contact info under    Signed, Alvan Carrier, MD  08/11/2023 11:09 AM

## 2023-08-11 NOTE — Progress Notes (Signed)
*  PRELIMINARY RESULTS* Echocardiogram 2D Echocardiogram has been performed with Def[facility].  Christian Ali 08/11/2023, 1:26 PM

## 2023-08-12 ENCOUNTER — Encounter (HOSPITAL_COMMUNITY): Payer: Self-pay | Admitting: Cardiology

## 2023-08-12 ENCOUNTER — Encounter (HOSPITAL_COMMUNITY)
Admission: EM | Disposition: A | Discharge status: Home / Self Care | Payer: Self-pay | Source: Home / Self Care | Attending: Emergency Medicine

## 2023-08-12 DIAGNOSIS — I5043 Acute on chronic combined systolic (congestive) and diastolic (congestive) heart failure: Secondary | ICD-10-CM | POA: Diagnosis not present

## 2023-08-12 DIAGNOSIS — I10 Essential (primary) hypertension: Secondary | ICD-10-CM | POA: Diagnosis not present

## 2023-08-12 DIAGNOSIS — I428 Other cardiomyopathies: Secondary | ICD-10-CM | POA: Diagnosis not present

## 2023-08-12 DIAGNOSIS — R079 Chest pain, unspecified: Secondary | ICD-10-CM | POA: Diagnosis not present

## 2023-08-12 DIAGNOSIS — Z720 Tobacco use: Secondary | ICD-10-CM

## 2023-08-12 DIAGNOSIS — I5021 Acute systolic (congestive) heart failure: Secondary | ICD-10-CM

## 2023-08-12 DIAGNOSIS — K219 Gastro-esophageal reflux disease without esophagitis: Secondary | ICD-10-CM

## 2023-08-12 HISTORY — PX: LEFT HEART CATH AND CORONARY ANGIOGRAPHY: CATH118249

## 2023-08-12 LAB — BASIC METABOLIC PANEL WITH GFR
Anion gap: 10 (ref 5–15)
BUN: 10 mg/dL (ref 6–20)
CO2: 26 mmol/L (ref 22–32)
Calcium: 9.3 mg/dL (ref 8.9–10.3)
Chloride: 101 mmol/L (ref 98–111)
Creatinine, Ser: 0.85 mg/dL (ref 0.61–1.24)
GFR, Estimated: >60 mL/min (ref >60)
Glucose, Bld: 121 mg/dL — ABNORMAL HIGH (ref 70–99)
Potassium: 4.2 mmol/L (ref 3.5–5.1)
Sodium: 137 mmol/L (ref 135–145)

## 2023-08-12 LAB — HEMOGLOBIN A1C
Hgb A1c MFr Bld: 5.6 % (ref 4.8–5.6)
Mean Plasma Glucose: 114.02 mg/dL

## 2023-08-12 LAB — CBC
HCT: 48.1 % (ref 39.0–52.0)
Hemoglobin: 16.7 g/dL (ref 13.0–17.0)
MCH: 33.7 pg (ref 26.0–34.0)
MCHC: 34.7 g/dL (ref 30.0–36.0)
MCV: 97 fL (ref 80.0–100.0)
Platelets: 210 10*3/uL (ref 150–400)
RBC: 4.96 MIL/uL (ref 4.22–5.81)
RDW: 12.6 % (ref 11.5–15.5)
WBC: 3.5 10*3/uL — ABNORMAL LOW (ref 4.0–10.5)
nRBC: 0 % (ref 0.0–0.2)

## 2023-08-12 LAB — LIPID PANEL
Cholesterol: 198 mg/dL (ref 0–200)
HDL: 81 mg/dL (ref >40)
LDL Cholesterol: 101 mg/dL — ABNORMAL HIGH (ref 0–99)
Total CHOL/HDL Ratio: 2.4 ratio
Triglycerides: 79 mg/dL (ref <150)
VLDL: 16 mg/dL (ref 0–40)

## 2023-08-12 LAB — MAGNESIUM: Magnesium: 2.3 mg/dL (ref 1.7–2.4)

## 2023-08-12 LAB — TSH: TSH: 2.489 u[IU]/mL (ref 0.350–4.500)

## 2023-08-12 LAB — HIV ANTIBODY (ROUTINE TESTING W REFLEX): HIV Screen 4th Generation wRfx: NONREACTIVE

## 2023-08-12 MED ORDER — PANTOPRAZOLE SODIUM 40 MG PO TBEC
40.0000 mg | DELAYED_RELEASE_TABLET | Freq: Every day | ORAL | Status: DC
  Administered 2023-08-12 – 2023-08-14 (×3): 40 mg via ORAL
  Filled 2023-08-12 (×3): qty 1

## 2023-08-12 MED ORDER — ASPIRIN 81 MG PO CHEW: 81.0000 mg | CHEWABLE_TABLET | ORAL | Status: DC

## 2023-08-12 MED ORDER — SODIUM CHLORIDE 0.9 % WEIGHT BASED INFUSION: 3.0000 mL/kg/h | INTRAVENOUS | Status: DC

## 2023-08-12 MED ORDER — SODIUM CHLORIDE 0.9 % WEIGHT BASED INFUSION: 1.0000 mL/kg/h | INTRAVENOUS | Status: DC

## 2023-08-12 MED ORDER — FENTANYL CITRATE (PF) 100 MCG/2ML IJ SOLN
INTRAMUSCULAR | Status: AC
  Filled 2023-08-12: qty 2

## 2023-08-12 MED ORDER — SODIUM CHLORIDE 0.9 % IV SOLN: 250.0000 mL | INTRAVENOUS | Status: AC | PRN

## 2023-08-12 MED ORDER — SODIUM CHLORIDE 0.9% FLUSH
3.0000 mL | Freq: Two times a day (BID) | INTRAVENOUS | Status: DC
  Administered 2023-08-12 – 2023-08-14 (×4): 3 mL via INTRAVENOUS

## 2023-08-12 MED ORDER — SODIUM CHLORIDE 0.9 % IV SOLN: INTRAVENOUS | Status: AC

## 2023-08-12 MED ORDER — MIDAZOLAM HCL 2 MG/2ML IJ SOLN
INTRAMUSCULAR | Status: DC | PRN
  Administered 2023-08-12: 2 mg via INTRAVENOUS

## 2023-08-12 MED ORDER — HEPARIN SODIUM (PORCINE) 1000 UNIT/ML IJ SOLN
INTRAMUSCULAR | Status: DC | PRN
  Administered 2023-08-12: 3500 [IU] via INTRAVENOUS

## 2023-08-12 MED ORDER — SODIUM CHLORIDE 0.9% FLUSH: 3.0000 mL | INTRAVENOUS | Status: DC | PRN

## 2023-08-12 MED ORDER — LIDOCAINE HCL (PF) 1 % IJ SOLN
INTRAMUSCULAR | Status: AC
  Filled 2023-08-12: qty 30

## 2023-08-12 MED ORDER — HEPARIN (PORCINE) IN NACL 2-0.9 UNITS/ML
INTRAMUSCULAR | Status: DC | PRN
  Administered 2023-08-12: 10 mL via INTRA_ARTERIAL

## 2023-08-12 MED ORDER — SODIUM CHLORIDE 0.9 % IV SOLN: INTRAVENOUS | Status: DC

## 2023-08-12 MED ORDER — ASPIRIN 81 MG PO CHEW
81.0000 mg | CHEWABLE_TABLET | ORAL | Status: AC
  Administered 2023-08-12: 81 mg via ORAL
  Filled 2023-08-12: qty 1

## 2023-08-12 MED ORDER — FENTANYL CITRATE (PF) 100 MCG/2ML IJ SOLN
INTRAMUSCULAR | Status: DC | PRN
  Administered 2023-08-12: 25 ug via INTRAVENOUS

## 2023-08-12 MED ORDER — HEPARIN SODIUM (PORCINE) 1000 UNIT/ML IJ SOLN
INTRAMUSCULAR | Status: AC
Start: 2023-08-12 — End: 2023-08-12
  Filled 2023-08-12: qty 10

## 2023-08-12 MED ORDER — VERAPAMIL HCL 2.5 MG/ML IV SOLN
INTRAVENOUS | Status: AC
  Filled 2023-08-12: qty 2

## 2023-08-12 MED ORDER — MIDAZOLAM HCL 2 MG/2ML IJ SOLN
INTRAMUSCULAR | Status: AC
  Filled 2023-08-12: qty 2

## 2023-08-12 MED ORDER — LIDOCAINE HCL (PF) 1 % IJ SOLN
INTRAMUSCULAR | Status: DC | PRN
  Administered 2023-08-12 (×2): 2 mL

## 2023-08-12 MED ORDER — HYDRALAZINE HCL 20 MG/ML IJ SOLN: 10.0000 mg | INTRAMUSCULAR | Status: AC | PRN

## 2023-08-12 MED ORDER — HEPARIN (PORCINE) IN NACL 1000-0.9 UT/500ML-% IV SOLN
INTRAVENOUS | Status: DC | PRN
  Administered 2023-08-12 (×2): 500 mL

## 2023-08-12 MED ORDER — IOHEXOL 350 MG/ML SOLN
INTRAVENOUS | Status: DC | PRN
  Administered 2023-08-12: 30 mL

## 2023-08-12 SURGICAL SUPPLY — 9 items
CATH 5FR JL3.5 JR4 ANG PIG MP (CATHETERS) IMPLANT
CATH BALLN WEDGE 5F 110CM (CATHETERS) IMPLANT
DEVICE RAD COMP TR BAND LRG (VASCULAR PRODUCTS) IMPLANT
GLIDESHEATH SLEND SS 6F .021 (SHEATH) IMPLANT
GUIDEWIRE INQWIRE 1.5J.035X260 (WIRE) IMPLANT
PACK CARDIAC CATHETERIZATION (CUSTOM PROCEDURE TRAY) ×1 IMPLANT
SET ATX-X65L (MISCELLANEOUS) IMPLANT
SHEATH GLIDE SLENDER 4/5FR (SHEATH) IMPLANT
SHEATH PROBE COVER 6X72 (BAG) IMPLANT

## 2023-08-12 NOTE — Interval H&P Note (Signed)
 History and Physical Interval Note:  08/12/2023 2:16 PM  Christian Ali  has presented today for surgery, with the diagnosis of chest pain -heart failure.  The various methods of treatment have been discussed with the patient and family. After consideration of risks, benefits and other options for treatment, the patient has consented to  Procedure(s): RIGHT/LEFT HEART CATH AND CORONARY ANGIOGRAPHY (N/A) as a surgical intervention.  The patient's history has been reviewed, patient examined, no change in status, stable for surgery.  I have reviewed the patient's chart and labs.  Questions were answered to the patient's satisfaction.   Cath Lab Visit (complete for each Cath Lab visit)  Clinical Evaluation Leading to the Procedure:   ACS: Yes.    Non-ACS:    Anginal Classification: CCS II  Anti-ischemic medical therapy: No Therapy  Non-Invasive Test Results: High-risk stress test findings: cardiac mortality >3%/year  Prior CABG: No previous CABG        [facility]ude [facility] 08/12/2023 2:16 PM

## 2023-08-12 NOTE — ED Notes (Signed)
 Attempted to call sister as requested but there was no answer at the number she gave.

## 2023-08-12 NOTE — Discharge Instructions (Signed)

## 2023-08-12 NOTE — Progress Notes (Signed)
 Received patient from [facility] via [facility] transportation. Transport uneventful. VSS. Pt c/o 2/10 chest pressure. PIVx2 present. Pt ambulated to the bathroom. Gait steady. No change in CP with activity.

## 2023-08-12 NOTE — ED Notes (Signed)
 Report given to [facility] nurse in Cath lab at [facility]

## 2023-08-12 NOTE — Progress Notes (Signed)
  Progress Note   Patient: Christian Ali FMW:[id number] DOB: 10-30-1975 DOA: 08/11/2023     0 DOS: the patient was seen and examined on 08/12/2023   Brief hospital admission narrative: As per H&P written by Dr. Maree on 08/11/2023 Christian Ali is a 48 y.o. male with no significant past medical history, but does not see physicians regularly who presented to the ED with complaints of chest pain that has been ongoing for the last 1 week.  It has become worse today and was initially noted to only occur with exertion, but now appears to be occurring a little bit with rest as well.  He has noted to have some associated shortness of breath.  He denies any orthopnea, lower extremity edema, cough, fevers, chills, or hemoptysis.  He does appear to have family history of CAD and does smoke.   ED Course: Vital signs with elevated blood pressure readings, but otherwise stable.  Laboratory data unremarkable and troponins at 7 and 9 on repeat.  EKG with no findings of ischemia.  Chest x-ray with no acute process.  Patient seen by cardiology with recommendations to admit for echocardiogram and possible stress testing in a.m.  Assessment and plan 1-atypical chest pain: - Troponin not demonstrating significant elevation - EKG without specific changes for ischemia - 2D echo with global hypokinesis and decreased ejection fraction - No chest pain or shortness of breath on today's evaluation - Patient n.p.o. pending transfer to [facility] for cardiac cath.  2-GERD - Continue PPI  3-hypertension - Continue Coreg and losartan - Heart healthy/low-sodium diet discussed with patient. - As needed hydralazine has been ordered.  4-acute systolic heart failure - Continue to follow cardiology service recommendation - Ejection fraction 25 to 30% (new diagnosis) - Cardiac cath planned for later today - Continue Coreg and losartan - Further GDMT/recommendations per cardiology service.  5-tobacco abuse -  Cessation counseling provided - Patient has declined nicotine patch at the moment.   Subjective:  Chest pain-free at the moment; no nausea, no vomiting, good saturation on room air.  N.p.o. for anticipated cardiac cath.  Physical Exam: Vitals:   08/12/23 0718 08/12/23 0728 08/12/23 0800 08/12/23 0900  BP:  (!) 160/101 (!) 142/99 (!) 143/102  Pulse:   80 62  Resp:  18 18 (!) 21  Temp: 98.1 F (36.7 C)   98.4 F (36.9 C)  TempSrc: Oral   Oral  SpO2:  99% 100% 99%  Weight:      Height:       General exam: Alert, awake, oriented x 3; in no major distress. Respiratory system: Good saturation on room air. Cardiovascular system:RRR. No rubs or gallops; no JVD. Gastrointestinal system: Abdomen is nondistended, soft and nontender. No organomegaly or masses felt. Normal bowel sounds heard. Central nervous system:No focal neurological deficits. Extremities: No C/C/E, +pedal pulses Skin: No rashes, lesions or ulcers Psychiatry: Judgement and insight appear normal. Mood & affect appropriate.    Data Reviewed: Magnesium:2.3 CBC: WBC 3.5, hemoglobin 16.7 and platelet count 210K Basic metabolic panel: Sodium 137, potassium 4.2, chloride 101, bicarb 26, BUN 10, creatinine 0.85 and GFR >60 TSH: 2.489 Lipid panel: Total cholesterol 198, HDL 81 and LDL 101  Family Communication: No family at bedside.  Disposition: Status is: Observation The patient remains OBS appropriate and will d/c before 2 midnights.  Time spent: 35 minutes  Author: Eric Nunnery, MD 08/12/2023 9:07 AM  For on call review [email or web address].

## 2023-08-13 ENCOUNTER — Telehealth (HOSPITAL_COMMUNITY): Payer: Self-pay | Admitting: Pharmacy Technician

## 2023-08-13 ENCOUNTER — Encounter (HOSPITAL_COMMUNITY): Payer: Self-pay | Admitting: Internal Medicine

## 2023-08-13 ENCOUNTER — Other Ambulatory Visit (HOSPITAL_COMMUNITY): Payer: Self-pay

## 2023-08-13 ENCOUNTER — Observation Stay (HOSPITAL_COMMUNITY)

## 2023-08-13 DIAGNOSIS — I5043 Acute on chronic combined systolic (congestive) and diastolic (congestive) heart failure: Secondary | ICD-10-CM | POA: Diagnosis not present

## 2023-08-13 DIAGNOSIS — I428 Other cardiomyopathies: Secondary | ICD-10-CM

## 2023-08-13 MED ORDER — ENOXAPARIN SODIUM 40 MG/0.4ML IJ SOSY
40.0000 mg | PREFILLED_SYRINGE | INTRAMUSCULAR | Status: DC
  Administered 2023-08-13: 40 mg via SUBCUTANEOUS
  Filled 2023-08-13: qty 0.4

## 2023-08-13 MED ORDER — SPIRONOLACTONE 12.5 MG HALF TABLET
12.5000 mg | ORAL_TABLET | Freq: Every day | ORAL | Status: DC
  Administered 2023-08-13 – 2023-08-14 (×2): 12.5 mg via ORAL
  Filled 2023-08-13 (×2): qty 1

## 2023-08-13 MED ORDER — EMPAGLIFLOZIN 10 MG PO TABS
10.0000 mg | ORAL_TABLET | Freq: Every day | ORAL | Status: DC
  Administered 2023-08-13: 10 mg via ORAL
  Filled 2023-08-13: qty 1

## 2023-08-13 MED ORDER — SACUBITRIL-VALSARTAN 49-51 MG PO TABS
1.0000 | ORAL_TABLET | Freq: Two times a day (BID) | ORAL | Status: DC
  Administered 2023-08-13 – 2023-08-14 (×3): 1 via ORAL
  Filled 2023-08-13 (×3): qty 1

## 2023-08-13 MED ORDER — GADOBUTROL 1 MMOL/ML IV SOLN
8.5000 mL | Freq: Once | INTRAVENOUS | Status: AC | PRN
  Administered 2023-08-13: 8.5 mL via INTRAVENOUS

## 2023-08-13 MED ORDER — DAPAGLIFLOZIN PROPANEDIOL 10 MG PO TABS
10.0000 mg | ORAL_TABLET | Freq: Every day | ORAL | Status: DC
  Administered 2023-08-14: 10 mg via ORAL
  Filled 2023-08-13: qty 1

## 2023-08-13 NOTE — Progress Notes (Signed)
 Heart Failure Nurse Navigator Progress Note  PCP: Patient, No Pcp Per PCP-Cardiologist: Branch  Admission Diagnosis: Chest pain.  Admitted from: Home  Presentation:   SEMIR BRILL presented with chest / epigastric pain x 1 week. BP 163/122, HR 64, Reports to not seeing a doctor regularly. CXR negative, Troponin's 7 and 9, was taken for a Left heart cath on 08/12/2023 results showed normal coronary anatomy. Had a cMRI on 08/13/23, results currently pending.   Patient and his girlfriend were educated on the sign and symptoms of heart failure, daily weights, when to call his doctor or go to the ED. Diet/ fluid restrictions, taking all medications as prescribed and attending all medical appointments. Patient verbalized his understanding of all education. A HF TOC appointment was scheduled for 08/19/2023 @ 2 pm.   ECHO/ LVEF: 25-30%  Clinical Course:  Past Medical History:  Diagnosis Date   H/O shoulder surgery      Social History   Socioeconomic History   Marital status: Single    Spouse name: Not on file   Number of children: Not on file   Years of education: Not on file   Highest education level: Not on file  Occupational History   Not on file  Tobacco Use   Smoking status: Every Day    Current packs/day: 1.00    Types: Cigarettes   Smokeless tobacco: Never  Substance and Sexual Activity   Alcohol use: Yes    Comment: occasionally   Drug use: Yes    Types: Marijuana    Comment: occ   Sexual activity: Yes  Other Topics Concern   Not on file  Social History Narrative   Not on file   Social Drivers of Health   Financial Resource Strain: Not on file  Food Insecurity: No Food Insecurity (08/11/2023)   Hunger Vital Sign    Worried About Running Out of Food in the Last Year: Never true    Ran Out of Food in the Last Year: Never true  Transportation Needs: No Transportation Needs (08/11/2023)   PRAPARE - Administrator, Civil Service (Medical): No    Lack of  Transportation (Non-Medical): No  Physical Activity: Not on file  Stress: Not on file  Social Connections: Not on file   Education Assessment and Provision:  Detailed education and instructions provided on heart failure disease management including the following:  Signs and symptoms of Heart Failure When to call the physician Importance of daily weights Low sodium diet Fluid restriction Medication management Anticipated future follow-up appointments  Patient education given on each of the above topics.  Patient acknowledges understanding via teach back method and acceptance of all instructions.  Education Materials:  Living Better With Heart Failure Booklet, HF zone tool, & Daily Weight Tracker Tool.  Patient has scale at home: Yes Patient has pill box at home: NA    High Risk Criteria for Readmission and/or Poor Patient Outcomes: Heart failure hospital admissions (last 6 months): 0  No Show rate: 0 Difficult social situation: No, lives with his 2 daughters.  Demonstrates medication adherence: Yes Primary Language: English  Literacy level: Reading, writing, and comprehension  Barriers of Care:   New CHF Diet/ fluid restrictions/ daily weights  Considerations/Referrals:   Referral made to Heart Failure Pharmacist Stewardship: yes Referral made to Heart Failure CSW/NCM TOC: NA Referral made to Heart & Vascular TOC clinic: Yes, 08/19/2023 @ 2 pm.   Items for Follow-up on DC/TOC: Continued New HF education Diet/  fluid restrictions/ daily weights   Stephane Haddock, BSN, RN Heart Failure Print production planner Chat Only

## 2023-08-13 NOTE — Progress Notes (Signed)
   Heart Failure Stewardship Pharmacist Progress Note   PCP: Patient, No Pcp Per PCP-Cardiologist: Alvan Carrier, MD    HPI:  48 yo M with no significant PMH. Does not see physicians regularly.   Presented to the ED on 6/30 with chest pain x 1 week. Associated with some shortness of breath. Denied orthopnea, LE edema, cough, fever, or chills. CXR with no acute process. Troponins 7>9. EKG with no findings of ischemia. ECHO 6/30 with LVEF 25-30%, global hypokinesis, G1DD, RV normal, trivial MR. Cardiology consulted. Taken for [facility] on 7/1 and found to have normal coronary anatomy, LVEDP 8. Pending cMRI results today.   States he is feeling well. Denies shortness of breath. No LE edema on exam. Reviewed updates to GDMT and answered all questions. He was not taking medications prior to this. Agreeable to using [facility] TOC pharmacy at discharge and [facility] mail order pharmacy for refills.   Current HF Medications: Beta Blocker: carvedilol 3.125 mg BID ACE/ARB/ARNI: Entresto 49/51 mg BID MRA: spironolactone 12.5 mg daily SGLT2i: Jardiance 10 mg daily  Prior to admission HF Medications: None  Pertinent Lab Values: Serum creatinine 0.85, BUN 10, Potassium 4.2, Sodium 137, Magnesium 2.3, A1c 5.6  Vital Signs: Weight: 126 lbs (admission weight: 137 lbs - likely an estimate) Blood pressure: 150/90s  Heart rate: 80-90s  I/O: incomplete  Medication Assistance / Insurance Benefits Check: Does the patient have prescription insurance?  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 Yes Is the patient willing to transition their outpatient pharmacy to utilize a [facility] outpatient pharmacy?   Yes    Assessment: 1. 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 NYHA class II symptoms. - Not fluid overloaded on exam. LVEDP low on cath. No  indication for diuretics at this time. - Consider increasing carvedilol to 6.25 mg BID - Agree with transitioning from losartan to Entresto 49/51 mg BID  - Agree with starting spironolactone 12.5 mg daily - Consider transitioning Jardiance to Farxiga 10 mg daily given prior authorization approval for Farxiga to $0   Plan: 1) Medication changes recommended at this time: - Switch Jardiance to Farxiga 10 mg daily  2) Patient assistance: [employer] copay $40 - Farxiga/Jardiance require prior authorization. Doreen PA approved, copay $25 - He is eligible for monthly copay cards - Console lowers to $10 and [place] to $0 - Will activate copay cards and upload to [facility] system  3)  Education  - Patient has been educated on current HF medications and potential additions to HF medication regimen - Patient verbalizes understanding that over the next few months, these medication doses may change and more medications may be added to optimize HF regimen - Patient has been educated on basic disease state pathophysiology and goals of therapy   Duwaine Plant, PharmD, BCPS Heart Failure Stewardship Pharmacist Phone [phone]

## 2023-08-13 NOTE — Progress Notes (Signed)
 Progress Note  Patient Name: Christian Ali Date of Encounter: 08/13/2023 [facility] Cardiologist: Alvan Carrier, MD   Interval Summary   During interview sitting comfortably on bed  Vital Signs Vitals:   08/12/23 1900 08/12/23 1918 08/12/23 2050 08/13/23 0103  BP: (!) 167/115 (!) 140/113 (!) 153/96 (!) 136/95  Pulse:  72  69  Resp:  16  18  Temp:  98.5 F (36.9 C)  98.4 F (36.9 C)  TempSrc:  Oral  Oral  SpO2:  100%  99%  Weight:      Height:        Intake/Output Summary (Last 24 hours) at 08/13/2023 0850 Last data filed at 08/12/2023 1930 Gross per 24 hour  Intake 1070 ml  Output --  Net 1070 ml      08/12/2023    5:48 PM 08/11/2023    9:12 AM 02/19/2022    5:56 AM  Last 3 Weights  Weight (lbs) 126 lb 4.8 oz 137 lb 140 lb  Weight (kg) 57.289 kg 62.143 kg 63.504 kg      Telemetry/ECG  Normal sinus rhythm resting rates 70-80's.  Frequent PAC's, has had multiple runs of what appears to be non sustained SVT with rates in 100's to 110's the longest lasted about 10 minutes.- Personally Reviewed  Physical Exam  GEN: No acute distress, alert and orientated on room air. Neck: No JVD Cardiac: RRR, no murmurs, rubs, or gallops.  Respiratory: Clear to auscultation bilaterally. GI: Soft, nontender, non-distended  MS: No edema  Assessment & Plan  Christian Ali is a 48 y.o. male with no significant PMH but does not see physicians regularly who is being seen 08/11/2023 for the evaluation of epigastric/chest pain at the request of PA Rigney.  Epigastric/chest pain Chest pain for the past week. Had some atypical features. Occurs at rest or with exertion.  Chest pain has worsened.  High-sensitivity troponins negative.  EKG showed LVH, inferior and lateral T wave inversions that were seen on prior EKG, and no evidence of ischemia.  Cardiac cath on 08/12/2023 showed normal vessels and no CAD. LDL 101 on 08/12/2023.  LPA pending.  A1c 5.6. with no CAD and no diabetes  lipid-lowering agents are necessary at this time.    Cardiomyopathy LVEF 25-30% Echo this hospitalization showed LVEF 25 to 30%, global hypokinesis, G1DD, moderately dilated LV, normal RV systolic function, and respiratory variability of the IVC indicating normal RV pressure. Unclear etiology of his cardiomyopathy. High bp and LVH on EKG suspect possible hypertensive CM however no significant LVH by echo. Denies any heavy EtOH or drug use other than marijuana, no significant family history of heart disease. No recent viral infections. Chest pain remains fairly atypical without objective evidence of ischemia thus far but based on echo.  Cardiac cath on 08/12/2023 showed normal vessels and no CAD.  LVEDP was normal.  Cardiac MRI done this morning results pending. Needs follow-up echo in 3 months.  If LVEF continues to be reduced consider ICD. GDMT Had already ready started norvasc prior to echo results, will d/c.  Blood pressures have been elevated most recent BP 136/95. Consulted pharmacy on cost of Entresto and Jardiance and they should cost $10. Stop losartan 25mg , Start Entresto 51-49 twice daily.  Continue coreg 3.125mg  bid.   Start Jardiance 10mg  daily Start spirolactone 12.5 mg daily.   Frequent PACs Non sustained SVT Ventricular bigeminy Telemetry shows frequent PACs and nonsustained SVT.  Prior EKGs showed runs of ventricular bigeminy.  The patient's cardiomyopathy may be related to arrhythmias.  TSH normal.  On 08/12/2023 potassium 4.2, magnesium 2.3. Will discuss with MD ordering outpatient cardiac monitor.   HTN Prior to hospitalization was not diagnosed with hypertension but does not have a PCP.  EKG showed LVH.  No LVH mentioned on echo.  Renin/Aldo pending.  Cardiac MRI pending. Manage blood pressure medications per GDMT above.           For questions or updates, please contact [facility] Please consult [email or web address] for contact info under       Signed, Deklyn Trachtenberg, PA-C

## 2023-08-13 NOTE — Progress Notes (Signed)
 PROGRESS NOTE    Christian Ali  FMW:[id number] DOB: 16-May-1975 DOA: 08/11/2023 PCP: Patient, No Pcp Per    Brief Narrative:  48 year old gentleman without any significant medical history presented with about 1 month of chest discomfort, 1 week of worsening symptoms and orthopnea and PND.  Emergency room, hemodynamically stable.  Troponins normal.  Chest x-ray with no acute process.  Admitted with cardiology consultation.  Patient was found to have cardiomyopathy with wall motion abnormalities and was transferred from [facility] to the [facility] for cardiac cath and investigations.  Subjective: Patient seen and examined in the morning rounds.  Patient tells me that he  has slight discomfort in the middle of the chest.  He does endorse symptoms of PND.  Girlfriend at the bedside.  Assessment & Plan:   Atypical chest pain: EKG and troponins nonischemic.  Cardiac catheterization without any coronary artery disease.  Symptomatic management.  Nonischemic cardiomyopathy: Patient was found to have severe cardiomyopathy with ejection fraction 25 to 30%. Underwent cardiac cath with normal coronaries. Cardiac MRI done today,  - Severe left ventricular failure, global hypokinesis, ejection fraction 23% -No evidence of infiltrative heart disease. Cardiology following. GDMT with carvedilol 3.125 twice daily, Farxiga 10 mg daily, Entresto 49-51 mg, Aldactone 12.5 mg daily.  Mobilize and monitor.  GERD: On PPI.  Continue.  Essential hypertension: As above.  Started on Coreg and Entresto.  Smoker: Counseled to quit.  He is ready to quit.  Declines nicotine patch.   DVT prophylaxis: SCDs, start Lovenox.   Code Status: Full code Family Communication: Girlfriend at the bedside Disposition Plan: Status is: Observation The patient will require care spanning > 2 midnights and should be moved to inpatient because: New onset congestive heart failure, investigations and medication  adjustments     Consultants:  Cardiology  Procedures:  Cardiac cath  Antimicrobials:  None     Objective: Vitals:   08/12/23 1918 08/12/23 2050 08/13/23 0103 08/13/23 0852  BP: (!) 140/113 (!) 153/96 (!) 136/95 (!) 151/86  Pulse: 72  69 94  Resp: 16  18 20   Temp: 98.5 F (36.9 C)  98.4 F (36.9 C) 98.6 F (37 C)  TempSrc: Oral  Oral Oral  SpO2: 100%  99% 99%  Weight:      Height:        Intake/Output Summary (Last 24 hours) at 08/13/2023 1237 Last data filed at 08/13/2023 0928 Gross per 24 hour  Intake 1310 ml  Output --  Net 1310 ml   Filed Weights   08/11/23 0912 08/12/23 1748  Weight: 62.1 kg (P) 57.3 kg    Examination:  General exam: Appears calm and comfortable  Respiratory system: Clear to auscultation. Respiratory effort normal. Cardiovascular system: S1 & S2 heard, RRR. No JVD, murmurs, rubs, gallops or clicks. No pedal edema. Gastrointestinal system: Abdomen is nondistended, soft and nontender. No organomegaly or masses felt. Normal bowel sounds heard. Central nervous system: Alert and oriented. No focal neurological deficits. Extremities: Symmetric 5 x 5 power.    Data Reviewed: I have personally reviewed following labs and imaging studies  CBC: Recent Labs  Lab 08/11/23 0922 08/12/23 0756  WBC 3.0* 3.5*  NEUTROABS 1.2*  --   HGB 14.4 16.7  HCT 42.8 48.1  MCV 96.6 97.0  PLT 187 210   Basic Metabolic Panel: Recent Labs  Lab 08/11/23 0922 08/12/23 0756  NA 139 137  K 3.7 4.2  CL 105 101  CO2 23 26  GLUCOSE  87 121*  BUN 16 10  CREATININE 0.85 0.85  CALCIUM 8.6* 9.3  MG  --  2.3   GFR: Estimated Creatinine Clearance: 92.5 mL/min (by C-G formula based on SCr of 0.85 mg/dL). Liver Function Tests: Recent Labs  Lab 08/11/23 0922  AST 30  ALT 22  ALKPHOS 69  BILITOT 0.3  PROT 7.0  ALBUMIN 4.2   No results for input(s): LIPASE, AMYLASE in the last 168 hours. No results for input(s): AMMONIA in the last 168  hours. Coagulation Profile: No results for input(s): INR, PROTIME in the last 168 hours. Cardiac Enzymes: No results for input(s): CKTOTAL, CKMB, CKMBINDEX, TROPONINI in the last 168 hours. BNP (last 3 results) No results for input(s): PROBNP in the last 8760 hours. HbA1C: Recent Labs    08/11/23 0922  HGBA1C 5.6   CBG: No results for input(s): GLUCAP in the last 168 hours. Lipid Profile: Recent Labs    08/12/23 0756  CHOL 198  HDL 81  LDLCALC 101*  TRIG 79  CHOLHDL 2.4   Thyroid Function Tests: Recent Labs    08/12/23 0756  TSH 2.489   Anemia Panel: No results for input(s): VITAMINB12, FOLATE, FERRITIN, TIBC, IRON, RETICCTPCT in the last 72 hours. Sepsis Labs: No results for input(s): PROCALCITON, LATICACIDVEN in the last 168 hours.  No results found for this or any previous visit (from the past 240 hours).       Radiology Studies: MR CARDIAC MORPHOLOGY W WO CONTRAST Result Date: 08/13/2023 CLINICAL DATA:  Cardiomyopathy EXAM: CARDIAC MRI TECHNIQUE: The patient was scanned on a 1.5 Tesla Siemens magnet. A dedicated cardiac coil was used. Functional imaging was done using Fiesta sequences. 2,3, and 4 chamber views were done to assess for RWMA's. Modified Simpson's rule using a short axis stack was used to calculate an ejection fraction on a dedicated work Research officer, trade [employer]. The patient received 8.5 cc of Gadavist. After 10 minutes inversion recovery sequences were used to assess for infiltration and scar tissue. CONTRAST:  Gadavist FINDINGS: Normal atrial sizes. No ASD/PFO. Trivial posterior basal pericardial effusion. Normal ascending thoracic aorta 2.8 cm Normal tri leaflet AV. Mild MV thickening with trivial MR. Normal PV/TV. Normal RV size and function. Severe LVE with global hypokinesis Septal thickness 12 mm mild LVH. Quantitative LVEF: 23% (EDV 172 cc ESV 133 cc SV 39 cc) Estimated cardiac output using flow analysis 2.7  L/min Quantitative RVEF: 46% (EDV 121 cc ESV 66 cc SV 55 cc ) Delayed enhancement images with gadolinium show no uptake, infarct, scar or infiltration Parametric Measures using Hct 48 T1 normal 1046 msec ECV normal 27% T2 normal 47 msec IMPRESSION: 1 Severe LVE with global hypokinesis LVEF 23% mild LVH septal thickness 12 mm 2.  Low normal RVEF with no enlargement RVEF 46% 3.  Normal parametric measures see values above 4.  No delayed gadolinium uptake on inversion recovery sequences 5.  Trivial posterior basal pericardial effusion 6.  Trivial appearing MR 7.  Estimated cardiac output using flow analysis 2.7 L/min Maude Emmer Electronically Signed   By: Maude Emmer M.D.   On: 08/13/2023 11:05   MR CARDIAC VELOCITY FLOW MAP Result Date: 08/13/2023 CLINICAL DATA:  Cardiomyopathy EXAM: CARDIAC MRI TECHNIQUE: The patient was scanned on a 1.5 Tesla Siemens magnet. A dedicated cardiac coil was used. Functional imaging was done using Fiesta sequences. 2,3, and 4 chamber views were done to assess for RWMA's. Modified Simpson's rule using a short axis stack was used to calculate an  ejection fraction on a dedicated work Research officer, trade union. The patient received 8.5 cc of Gadavist. After 10 minutes inversion recovery sequences were used to assess for infiltration and scar tissue. CONTRAST:  Gadavist FINDINGS: Normal atrial sizes. No ASD/PFO. Trivial posterior basal pericardial effusion. Normal ascending thoracic aorta 2.8 cm Normal tri leaflet AV. Mild MV thickening with trivial MR. Normal PV/TV. Normal RV size and function. Severe LVE with global hypokinesis Septal thickness 12 mm mild LVH. Quantitative LVEF: 23% (EDV 172 cc ESV 133 cc SV 39 cc) Estimated cardiac output using flow analysis 2.7 L/min Quantitative RVEF: 46% (EDV 121 cc ESV 66 cc SV 55 cc ) Delayed enhancement images with gadolinium show no uptake, infarct, scar or infiltration Parametric Measures using Hct 48 T1 normal 1046 msec ECV normal 27% T2  normal 47 msec IMPRESSION: 1 Severe LVE with global hypokinesis LVEF 23% mild LVH septal thickness 12 mm 2.  Low normal RVEF with no enlargement RVEF 46% 3.  Normal parametric measures see values above 4.  No delayed gadolinium uptake on inversion recovery sequences 5.  Trivial posterior basal pericardial effusion 6.  Trivial appearing MR 7.  Estimated cardiac output using flow analysis 2.7 L/min Maude Emmer Electronically Signed   By: Maude Emmer M.D.   On: 08/13/2023 11:05   MR CARDIAC VELOCITY FLOW MAP Result Date: 08/13/2023 CLINICAL DATA:  Cardiomyopathy EXAM: CARDIAC MRI TECHNIQUE: The patient was scanned on a 1.5 Tesla Siemens magnet. A dedicated cardiac coil was used. Functional imaging was done using Fiesta sequences. 2,3, and 4 chamber views were done to assess for RWMA's. Modified Simpson's rule using a short axis stack was used to calculate an ejection fraction on a dedicated work Research officer, trade union. The patient received 8.5 cc of Gadavist. After 10 minutes inversion recovery sequences were used to assess for infiltration and scar tissue. CONTRAST:  Gadavist FINDINGS: Normal atrial sizes. No ASD/PFO. Trivial posterior basal pericardial effusion. Normal ascending thoracic aorta 2.8 cm Normal tri leaflet AV. Mild MV thickening with trivial MR. Normal PV/TV. Normal RV size and function. Severe LVE with global hypokinesis Septal thickness 12 mm mild LVH. Quantitative LVEF: 23% (EDV 172 cc ESV 133 cc SV 39 cc) Estimated cardiac output using flow analysis 2.7 L/min Quantitative RVEF: 46% (EDV 121 cc ESV 66 cc SV 55 cc ) Delayed enhancement images with gadolinium show no uptake, infarct, scar or infiltration Parametric Measures using Hct 48 T1 normal 1046 msec ECV normal 27% T2 normal 47 msec IMPRESSION: 1 Severe LVE with global hypokinesis LVEF 23% mild LVH septal thickness 12 mm 2.  Low normal RVEF with no enlargement RVEF 46% 3.  Normal parametric measures see values above 4.  No delayed  gadolinium uptake on inversion recovery sequences 5.  Trivial posterior basal pericardial effusion 6.  Trivial appearing MR 7.  Estimated cardiac output using flow analysis 2.7 L/min Maude Emmer Electronically Signed   By: Maude Emmer M.D.   On: 08/13/2023 11:05   CARDIAC CATHETERIZATION Result Date: 08/12/2023   LV end diastolic pressure is normal. Normal coronary anatomy Low LVEDP 8 mm Hg Plan: will obtain cardiac MRI. Optimize GDMT for CHF.   ECHOCARDIOGRAM COMPLETE Result Date: 08/11/2023    ECHOCARDIOGRAM REPORT   Patient Name:   JOEY LIERMAN [facility] Date of Exam: 08/11/2023 Medical Rec #:  [id number]         Height:       65.0 in Accession #:    [id number]  Weight:       137.0 lb Date of Birth:  [birth year]         BSA:          1.684 m Patient Age:    48 years          BP:           158/111 mmHg Patient Gender: M                 HR:           62 bpm. Exam Location:  [facility] Procedure: 2D Echo, 3D Echo, Cardiac Doppler, Color Doppler and Intracardiac            Opacification Agent (Both Spectral and Color Flow Doppler were            utilized during procedure). Indications:    Chest Pain R07.9  History:        Patient has no prior history of Echocardiogram examinations.  Sonographer:    Aida Pizza RCS Referring Phys: [id number] DORN FALCON BRANCH IMPRESSIONS  1. Left ventricular ejection fraction, by estimation, is 25 to 30%. The left ventricle has severely decreased function. The left ventricle demonstrates global hypokinesis. The left ventricular internal cavity size was moderately dilated. Left ventricular diastolic parameters are consistent with Grade I diastolic dysfunction (impaired relaxation).  2. Right ventricular systolic function is normal. The right ventricular size is normal. Tricuspid regurgitation signal is inadequate for assessing PA pressure.  3. The mitral valve is normal in structure. Trivial mitral valve regurgitation. No evidence of mitral stenosis.  4. The aortic valve is  tricuspid. Aortic valve regurgitation is not visualized. No aortic stenosis is present.  5. The inferior vena cava is normal in size with greater than 50% respiratory variability, suggesting right atrial pressure of 3 mmHg. FINDINGS  Left Ventricle: Left ventricular ejection fraction, by estimation, is 25 to 30%. The left ventricle has severely decreased function. The left ventricle demonstrates global hypokinesis. Definity contrast agent was given IV to delineate the left ventricular endocardial borders. The left ventricular internal cavity size was moderately dilated. There is no left ventricular hypertrophy. Left ventricular diastolic parameters are consistent with Grade I diastolic dysfunction (impaired relaxation). Normal left ventricular filling pressure. Right Ventricle: The right ventricular size is normal. Right vetricular wall thickness was not well visualized. Right ventricular systolic function is normal. Tricuspid regurgitation signal is inadequate for assessing PA pressure. Left Atrium: Left atrial size was normal in size. Right Atrium: Right atrial size was normal in size. Pericardium: There is no evidence of pericardial effusion. Mitral Valve: The mitral valve is normal in structure. Trivial mitral valve regurgitation. No evidence of mitral valve stenosis. Tricuspid Valve: The tricuspid valve is normal in structure. Tricuspid valve regurgitation is trivial. No evidence of tricuspid stenosis. Aortic Valve: The aortic valve is tricuspid. Aortic valve regurgitation is not visualized. No aortic stenosis is present. Aortic valve mean gradient measures 4.3 mmHg. Aortic valve peak gradient measures 8.4 mmHg. Aortic valve area, by VTI measures 1.78 cm. Pulmonic Valve: The pulmonic valve was not well visualized. Pulmonic valve regurgitation is not visualized. No evidence of pulmonic stenosis. Aorta: The aortic root is normal in size and structure. Venous: The inferior vena cava is normal in size with greater  than 50% respiratory variability, suggesting right atrial pressure of 3 mmHg. IAS/Shunts: No atrial level shunt detected by color flow Doppler. Additional Comments: 3D was performed not requiring image post processing on an independent workstation and  was abnormal.  LEFT VENTRICLE PLAX 2D LVIDd:         6.40 cm   Diastology LVIDs:         5.40 cm   LV e' medial:    6.42 cm/s LV PW:         1.00 cm   LV E/e' medial:  7.7 LV IVS:        0.90 cm   LV e' lateral:   7.83 cm/s LVOT diam:     2.00 cm   LV E/e' lateral: 6.3 LV SV:         45 LV SV Index:   27 LVOT Area:     3.14 cm                           3D Volume EF:                          3D EF:        44 %                          LV EDV:       194 ml                          LV ESV:       109 ml                          LV SV:        85 ml RIGHT VENTRICLE RV S prime:     13.30 cm/s TAPSE (M-mode): 1.8 cm LEFT ATRIUM             Index        RIGHT ATRIUM           Index LA diam:        3.10 cm 1.84 cm/m   RA Area:     12.00 cm LA Vol (A2C):   48.1 ml 28.56 ml/m  RA Volume:   25.90 ml  15.38 ml/m LA Vol (A4C):   53.3 ml 31.64 ml/m LA Biplane Vol: 53.6 ml 31.82 ml/m  AORTIC VALVE AV Area (Vmax):    1.71 cm AV Area (Vmean):   1.64 cm AV Area (VTI):     1.78 cm AV Vmax:           144.76 cm/s AV Vmean:          96.812 cm/s AV VTI:            0.252 m AV Peak Grad:      8.4 mmHg AV Mean Grad:      4.3 mmHg LVOT Vmax:         79.00 cm/s LVOT Vmean:        50.500 cm/s LVOT VTI:          0.143 m LVOT/AV VTI ratio: 0.57  AORTA Ao Root diam: 3.50 cm MITRAL VALVE MV Area (PHT): 3.27 cm    SHUNTS MV Decel Time: 232 msec    Systemic VTI:  0.14 m MV E velocity: 49.30 cm/s  Systemic Diam: 2.00 cm MV A velocity: 89.00 cm/s MV E/A ratio:  0.55 Dorn Ross MD Electronically signed by Dorn Ross MD Signature Date/Time: 08/11/2023/1:46:21 PM    Final  Scheduled Meds:  carvedilol  3.125 mg Oral BID WC   [START ON 08/14/2023] dapagliflozin propanediol  10  mg Oral Daily   pantoprazole  40 mg Oral Daily   sacubitril-valsartan  1 tablet Oral BID   sodium chloride flush  3 mL Intravenous Q12H   spironolactone  12.5 mg Oral Daily   Continuous Infusions:  sodium chloride       LOS: 0 days    Time spent: 40 minutes    Jett Kulzer, MD [facility]

## 2023-08-13 NOTE — Telephone Encounter (Signed)
 Patient Product/process development scientist completed.    The patient is insured through [employer]. Patient has [employer], may use a copay card, and/or apply for patient assistance if available.    Ran test claim for Entresto 24-26 mg and the current 30 day co-pay is $40.00.  Ran test claim for Farxiga 10 mg and Requires Prior Authorization  Ran test claim for Jardiance 10 mg and Requires Prior Authorization  This test claim was processed through [employer]- copay amounts may vary at other pharmacies due to [employer], or as the patient moves through the different stages of their insurance plan.     Reyes Sharps, CPHT Pharmacy Technician III Certified Patient Advocate [facility] Patient Advocate Team Direct Number: [phone]  Fax: [phone]

## 2023-08-13 NOTE — Plan of Care (Signed)

## 2023-08-13 NOTE — Telephone Encounter (Signed)
 Pharmacy Patient Advocate Encounter  Received notification from [employer] that Prior Authorization for Farxiga 10MG  tablets has been APPROVED from 08/13/2023 to 02/10/2098. Ran test claim, Copay is $25.00. This test claim was processed through [facility]- copay amounts may vary at other pharmacies due to pharmacy/plan contracts, or as the patient moves through the different stages of their insurance plan.   PA #/Case ID/Reference #: [id number] Key: [id number]

## 2023-08-14 ENCOUNTER — Other Ambulatory Visit: Payer: Self-pay | Admitting: Student

## 2023-08-14 ENCOUNTER — Other Ambulatory Visit (HOSPITAL_COMMUNITY): Payer: Self-pay

## 2023-08-14 ENCOUNTER — Inpatient Hospital Stay (HOSPITAL_COMMUNITY)
Admit: 2023-08-14 | Discharge: 2023-08-14 | Disposition: A | Discharge status: Home / Self Care | Attending: Student | Admitting: Student

## 2023-08-14 DIAGNOSIS — I493 Ventricular premature depolarization: Secondary | ICD-10-CM

## 2023-08-14 DIAGNOSIS — I5043 Acute on chronic combined systolic (congestive) and diastolic (congestive) heart failure: Secondary | ICD-10-CM

## 2023-08-14 DIAGNOSIS — I472 Ventricular tachycardia, unspecified: Secondary | ICD-10-CM

## 2023-08-14 DIAGNOSIS — I471 Supraventricular tachycardia, unspecified: Secondary | ICD-10-CM

## 2023-08-14 LAB — LIPOPROTEIN A (LPA): Lipoprotein (a): 27.9 nmol/L (ref <75.0)

## 2023-08-14 MED ORDER — CARVEDILOL 6.25 MG PO TABS
6.2500 mg | ORAL_TABLET | Freq: Two times a day (BID) | ORAL | 0 refills | Status: DC
  Filled 2023-08-14: qty 60, 30d supply, fill #0
  Filled 2023-09-12: qty 120, 60d supply, fill #0

## 2023-08-14 MED ORDER — SACUBITRIL-VALSARTAN 49-51 MG PO TABS
1.0000 | ORAL_TABLET | Freq: Two times a day (BID) | ORAL | 0 refills | Status: DC
  Filled 2023-08-14: qty 60, 30d supply, fill #0
  Filled 2023-09-12: qty 120, 60d supply, fill #0

## 2023-08-14 MED ORDER — DAPAGLIFLOZIN PROPANEDIOL 10 MG PO TABS
10.0000 mg | ORAL_TABLET | Freq: Every day | ORAL | 0 refills | Status: DC
  Filled 2023-08-14: qty 30, 30d supply, fill #0
  Filled 2023-09-12: qty 60, 60d supply, fill #0

## 2023-08-14 MED ORDER — CARVEDILOL 6.25 MG PO TABS: 6.2500 mg | ORAL_TABLET | Freq: Two times a day (BID) | ORAL | Status: DC

## 2023-08-14 MED ORDER — PANTOPRAZOLE SODIUM 40 MG PO TBEC
40.0000 mg | DELAYED_RELEASE_TABLET | Freq: Every day | ORAL | 0 refills | Status: DC
  Filled 2023-08-14: qty 30, 30d supply, fill #0
  Filled 2023-09-12: qty 60, 60d supply, fill #0

## 2023-08-14 MED ORDER — SPIRONOLACTONE 25 MG PO TABS
12.5000 mg | ORAL_TABLET | Freq: Every day | ORAL | 0 refills | Status: DC
  Filled 2023-08-14: qty 15, 30d supply, fill #0

## 2023-08-14 NOTE — Progress Notes (Signed)
   Heart Failure Stewardship Pharmacist Progress Note   PCP: Patient, No Pcp Per PCP-Cardiologist: Alvan Carrier, MD    HPI:  48 yo M with no significant PMH. Does not see physicians regularly.   Presented to the ED on 6/30 with chest pain x 1 week. Associated with some shortness of breath. Denied orthopnea, LE edema, cough, fever, or chills. CXR with no acute process. Troponins 7>9. EKG with no findings of ischemia. ECHO 6/30 with LVEF 25-30%, global hypokinesis, G1DD, RV normal, trivial MR. Cardiology consulted. Taken for [facility] on 7/1 and found to have normal coronary anatomy, LVEDP 8. cMRI with severe LVE with global hypokinesis, LVEF 23%, low normal RVEF 46%, no delayed gadolinium uptake, trivial posterior basal pericardial effusion.   Denies shortness of breath, no LE edema. No lightheadedness or dizziness. Anticipated discharge today. He was not taking medications prior to this. Agreeable to using [facility] TOC pharmacy at discharge and [facility] mail order pharmacy for refills.   Current HF Medications: Beta Blocker: carvedilol 3.125 mg BID ACE/ARB/ARNI: Entresto 49/51 mg BID MRA: spironolactone 12.5 mg daily SGLT2i: Farxiga 10 mg daily  Prior to admission HF Medications: None  Pertinent Lab Values: As of 7/1: Serum creatinine 0.85, BUN 10, Potassium 4.2, Sodium 137, Magnesium 2.3, A1c 5.6  Vital Signs: Weight: 126 lbs (admission weight: 137 lbs - likely an estimate) Blood pressure: 120/80s  Heart rate: 70-80s  I/O: incomplete  Medication Assistance / Insurance Benefits Check: Does the patient have prescription insurance?  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 Yes Is the patient willing to transition their outpatient pharmacy to utilize a [facility] outpatient pharmacy?   Yes    Assessment: 1. Acute systolic CHF (LVEF 25-30%), due to  NICM. NYHA class II symptoms. - Not fluid overloaded on exam. LVEDP low on cath. No indication for diuretics at this time. - Consider increasing carvedilol to 6.25 mg BID. Telemetry shows frequent PACs and nonsustained SVT, frequent PVC's, and ventricular bigeminy. Discussed with cardiology and they do not feel he needs LifeVest at this time.  - Continue Entresto 49/51 mg BID  - Continue spironolactone 12.5 mg daily - no new labs today - Continue Farxiga 10 mg daily   Plan: 1) Medication changes recommended at this time: - Increase carvedilol to 6.25 mg BID  2) Patient assistance: [place] [facility] copay $40 - Farxiga/Jardiance require prior authorization. Doreen PA approved, copay $25 - He is eligible for monthly copay cards - Console lowers to $10 and [place] to $0 - Will activate copay cards and upload to [facility] system  3)  Education  - Patient has been educated on current HF medications and potential additions to HF medication regimen - Patient verbalizes understanding that over the next few months, these medication doses may change and more medications may be added to optimize HF regimen - Patient has been educated on basic disease state pathophysiology and goals of therapy   Duwaine Plant, PharmD, BCPS Heart Failure Stewardship Pharmacist Phone [phone]

## 2023-08-14 NOTE — Progress Notes (Signed)
   Heart Failure Stewardship Pharmacist Progress Note   Was successful in obtaining a copay card for [facility].  This copay card will make the patients copay $10.  The billing information is as follows and has been shared with the [facility] pharmacy system.  RxBin: [id number] PCN: [id number] Member ID: [id number] Group ID: [id number]  Was successful in obtaining a copay card for Farxiga.  This copay card will make the patients copay $0.  The billing information is as follows and has been shared with the [facility] pharmacy system.  RxBin: [id number]  PCN: [id number]  Member ID: [id number]  Group ID: [id number]    Duwaine Plant, PharmD, BCPS Heart Failure Stewardship Pharmacist Phone [phone]

## 2023-08-14 NOTE — Addendum Note (Signed)
 Addended by: Hearl Heikes on: 08/14/2023 10:49 AM   Modules accepted: Orders

## 2023-08-14 NOTE — Discharge Summary (Signed)
 Physician Discharge Summary  Christian Ali FMW:[id number] DOB: 06-Feb-1976 DOA: 08/11/2023  PCP: Patient, No Pcp Per  Admit date: 08/11/2023 Discharge date: 08/14/2023  Admitted From: Home Disposition: Home  Recommendations for Outpatient Follow-up:  Follow up with PCP in 1-2 weeks Please obtain BMP/CBC in one week Cardiology to schedule follow-up  Home Health: N/A Equipment/Devices: N/A  Discharge Condition: Stable CODE STATUS: Full code Diet recommendation: Low-salt diet  Discharge summary: 48 year old gentleman without any significant medical history presented with about 1 month of chest discomfort, 1 week of worsening symptoms and orthopnea and PND. Emergency room, hemodynamically stable. Troponins normal. Chest x-ray with no acute process. Admitted with cardiology consultation. Patient was found to have cardiomyopathy with wall motion abnormalities and was transferred from [facility] to the [facility] for cardiac cath and investigations.   Atypical chest pain: EKG and troponins nonischemic.  Cardiac catheterization without any coronary artery disease.  Symptomatic management.   Nonischemic cardiomyopathy: Patient was found to have severe cardiomyopathy with ejection fraction 25 to 30%. Underwent cardiac cath with normal coronaries. Cardiac MRI with - Severe left ventricular failure, global hypokinesis, ejection fraction 23% -No evidence of infiltrative heart disease. Cardiology following.  Clinically stabilizing.  Patient is going home with GDMT with carvedilol 6.25 mg twice daily, Farxiga 10 mg daily, Entresto 49-51 mg, Aldactone 12.5 mg daily.  Mobilize and monitor.   GERD: On PPI.  Continue.   Essential hypertension: As above.  Started on Coreg and Entresto.   Smoker: Counseled to quit.  He is ready to quit.  Declines nicotine patch.  Medically stable for discharge.    Discharge Diagnoses:  Principal Problem:   Chest pain Active Problems:    Acute on chronic combined systolic and diastolic CHF (congestive heart failure) [facility])    Discharge Instructions  Discharge Instructions     Diet - low sodium heart healthy   Complete by: As directed    Increase activity slowly   Complete by: As directed       Allergies as of 08/14/2023   No Known Allergies      Medication List     STOP taking these medications    aspirin EC 81 MG tablet       TAKE these medications    carvedilol 6.25 MG tablet Commonly known as: COREG Take 1 tablet (6.25 mg total) by mouth 2 (two) times daily with a meal.   dapagliflozin propanediol 10 MG Tabs tablet Commonly known as: FARXIGA Take 1 tablet (10 mg total) by mouth daily. Start taking on: August 15, 2023   pantoprazole 40 MG tablet Commonly known as: PROTONIX Take 1 tablet (40 mg total) by mouth daily. Start taking on: August 15, 2023   sacubitril-valsartan 49-51 MG Commonly known as: ENTRESTO Take 1 tablet by mouth 2 (two) times daily.   spironolactone 25 MG tablet Commonly known as: ALDACTONE Take 0.5 tablets (12.5 mg total) by mouth daily. Start taking on: August 15, 2023        Follow-up Information     [facility]. Go in 6 day(s).   Specialty: Cardiology Why: Hosptial follow up 08/19/2023 @ 2 pm PLEASE bring a current medication list to appointment FREE valet parking, Entrance C, off [employer] [facility] [employer] and exercise psychologist information: [address] [place]  [phone]               No Known Allergies  Consultations: Cardiology  Procedures/Studies: MR CARDIAC MORPHOLOGY W WO CONTRAST Result Date: 08/13/2023 CLINICAL DATA:  Cardiomyopathy EXAM: CARDIAC MRI TECHNIQUE: The patient was scanned on a 1.5 Tesla Siemens magnet. A dedicated cardiac coil was used. Functional imaging was done using Fiesta sequences. 2,3, and 4 chamber views were done to assess for  RWMA's. Modified Simpson's rule using a short axis stack was used to calculate an ejection fraction on a dedicated work [employer] officer, trade [employer]. The patient received 8.5 cc of Gadavist. After 10 minutes inversion recovery sequences were used to assess for infiltration and scar tissue. CONTRAST:  Gadavist FINDINGS: Normal atrial sizes. No ASD/PFO. Trivial posterior basal pericardial effusion. Normal ascending thoracic aorta 2.8 cm Normal tri leaflet AV. Mild MV thickening with trivial MR. Normal PV/TV. Normal RV size and function. Severe LVE with global hypokinesis Septal thickness 12 mm mild LVH. Quantitative LVEF: 23% (EDV 172 cc ESV 133 cc SV 39 cc) Estimated cardiac output using flow analysis 2.7 L/min Quantitative RVEF: 46% (EDV 121 cc ESV 66 cc SV 55 cc ) Delayed enhancement images with gadolinium show no uptake, infarct, scar or infiltration Parametric Measures using Hct 48 T1 normal 1046 msec ECV normal 27% T2 normal 47 msec IMPRESSION: 1 Severe LVE with global hypokinesis LVEF 23% mild LVH septal thickness 12 mm 2.  Low normal RVEF with no enlargement RVEF 46% 3.  Normal parametric measures see values above 4.  No delayed gadolinium uptake on inversion recovery sequences 5.  Trivial posterior basal pericardial effusion 6.  Trivial appearing MR 7.  Estimated cardiac output using flow analysis 2.7 L/min Maude Emmer Electronically Signed   By: Maude Emmer M.D.   On: 08/13/2023 11:05   MR CARDIAC VELOCITY FLOW MAP Result Date: 08/13/2023 CLINICAL DATA:  Cardiomyopathy EXAM: CARDIAC MRI TECHNIQUE: The patient was scanned on a 1.5 Tesla Siemens magnet. A dedicated cardiac coil was used. Functional imaging was done using Fiesta sequences. 2,3, and 4 chamber views were done to assess for RWMA's. Modified Simpson's rule using a short axis stack was used to calculate an ejection fraction on a dedicated work Research officer, trade union. The patient received 8.5 cc of Gadavist. After 10 minutes inversion  recovery sequences were used to assess for infiltration and scar tissue. CONTRAST:  Gadavist FINDINGS: Normal atrial sizes. No ASD/PFO. Trivial posterior basal pericardial effusion. Normal ascending thoracic aorta 2.8 cm Normal tri leaflet AV. Mild MV thickening with trivial MR. Normal PV/TV. Normal RV size and function. Severe LVE with global hypokinesis Septal thickness 12 mm mild LVH. Quantitative LVEF: 23% (EDV 172 cc ESV 133 cc SV 39 cc) Estimated cardiac output using flow analysis 2.7 L/min Quantitative RVEF: 46% (EDV 121 cc ESV 66 cc SV 55 cc ) Delayed enhancement images with gadolinium show no uptake, infarct, scar or infiltration Parametric Measures using Hct 48 T1 normal 1046 msec ECV normal 27% T2 normal 47 msec IMPRESSION: 1 Severe LVE with global hypokinesis LVEF 23% mild LVH septal thickness 12 mm 2.  Low normal RVEF with no enlargement RVEF 46% 3.  Normal parametric measures see values above 4.  No delayed gadolinium uptake on inversion recovery sequences 5.  Trivial posterior basal pericardial effusion 6.  Trivial appearing MR 7.  Estimated cardiac output using flow analysis 2.7 L/min Maude Emmer Electronically Signed   By: Maude Emmer M.D.   On: 08/13/2023 11:05   MR CARDIAC VELOCITY FLOW MAP Result Date: 08/13/2023 CLINICAL DATA:  Cardiomyopathy EXAM: CARDIAC MRI TECHNIQUE: The patient was scanned on a  1.5 Tesla Siemens magnet. A dedicated cardiac coil was used. Functional imaging was done using Fiesta sequences. 2,3, and 4 chamber views were done to assess for RWMA's. Modified Simpson's rule using a short axis stack was used to calculate an ejection fraction on a dedicated work Research officer, trade union. The patient received 8.5 cc of Gadavist. After 10 minutes inversion recovery sequences were used to assess for infiltration and scar tissue. CONTRAST:  Gadavist FINDINGS: Normal atrial sizes. No ASD/PFO. Trivial posterior basal pericardial effusion. Normal ascending thoracic aorta 2.8 cm  Normal tri leaflet AV. Mild MV thickening with trivial MR. Normal PV/TV. Normal RV size and function. Severe LVE with global hypokinesis Septal thickness 12 mm mild LVH. Quantitative LVEF: 23% (EDV 172 cc ESV 133 cc SV 39 cc) Estimated cardiac output using flow analysis 2.7 L/min Quantitative RVEF: 46% (EDV 121 cc ESV 66 cc SV 55 cc ) Delayed enhancement images with gadolinium show no uptake, infarct, scar or infiltration Parametric Measures using Hct 48 T1 normal 1046 msec ECV normal 27% T2 normal 47 msec IMPRESSION: 1 Severe LVE with global hypokinesis LVEF 23% mild LVH septal thickness 12 mm 2.  Low normal RVEF with no enlargement RVEF 46% 3.  Normal parametric measures see values above 4.  No delayed gadolinium uptake on inversion recovery sequences 5.  Trivial posterior basal pericardial effusion 6.  Trivial appearing MR 7.  Estimated cardiac output using flow analysis 2.7 L/min Maude Emmer Electronically Signed   By: Maude Emmer M.D.   On: 08/13/2023 11:05   CARDIAC CATHETERIZATION Result Date: 08/12/2023   LV end diastolic pressure is normal. Normal coronary anatomy Low LVEDP 8 mm Hg Plan: will obtain cardiac MRI. Optimize GDMT for CHF.   ECHOCARDIOGRAM COMPLETE Result Date: 08/11/2023    ECHOCARDIOGRAM REPORT   Patient Name:   STEFANO TRULSON [facility] Date of Exam: 08/11/2023 Medical Rec #:  [id number]         Height:       65.0 in Accession #:    [id number]        Weight:       137.0 lb Date of Birth:  [birth year]         BSA:          1.684 m Patient Age:    48 years          BP:           158/111 mmHg Patient Gender: M                 HR:           62 bpm. Exam Location:  [facility] Procedure: 2D Echo, 3D Echo, Cardiac Doppler, Color Doppler and Intracardiac            Opacification Agent (Both Spectral and Color Flow Doppler were            utilized during procedure). Indications:    Chest Pain R07.9  History:        Patient has no prior history of Echocardiogram examinations.  Sonographer:    Aida Pizza RCS Referring Phys: [id number] DORN FALCON BRANCH IMPRESSIONS  1. Left ventricular ejection fraction, by estimation, is 25 to 30%. The left ventricle has severely decreased function. The left ventricle demonstrates global hypokinesis. The left ventricular internal cavity size was moderately dilated. Left ventricular diastolic parameters are consistent with Grade I diastolic dysfunction (impaired relaxation).  2. Right ventricular systolic function is normal. The right ventricular size  is normal. Tricuspid regurgitation signal is inadequate for assessing PA pressure.  3. The mitral valve is normal in structure. Trivial mitral valve regurgitation. No evidence of mitral stenosis.  4. The aortic valve is tricuspid. Aortic valve regurgitation is not visualized. No aortic stenosis is present.  5. The inferior vena cava is normal in size with greater than 50% respiratory variability, suggesting right atrial pressure of 3 mmHg. FINDINGS  Left Ventricle: Left ventricular ejection fraction, by estimation, is 25 to 30%. The left ventricle has severely decreased function. The left ventricle demonstrates global hypokinesis. Definity contrast agent was given IV to delineate the left ventricular endocardial borders. The left ventricular internal cavity size was moderately dilated. There is no left ventricular hypertrophy. Left ventricular diastolic parameters are consistent with Grade I diastolic dysfunction (impaired relaxation). Normal left ventricular filling pressure. Right Ventricle: The right ventricular size is normal. Right vetricular wall thickness was not well visualized. Right ventricular systolic function is normal. Tricuspid regurgitation signal is inadequate for assessing PA pressure. Left Atrium: Left atrial size was normal in size. Right Atrium: Right atrial size was normal in size. Pericardium: There is no evidence of pericardial effusion. Mitral Valve: The mitral valve is normal in structure. Trivial mitral  valve regurgitation. No evidence of mitral valve stenosis. Tricuspid Valve: The tricuspid valve is normal in structure. Tricuspid valve regurgitation is trivial. No evidence of tricuspid stenosis. Aortic Valve: The aortic valve is tricuspid. Aortic valve regurgitation is not visualized. No aortic stenosis is present. Aortic valve mean gradient measures 4.3 mmHg. Aortic valve peak gradient measures 8.4 mmHg. Aortic valve area, by VTI measures 1.78 cm. Pulmonic Valve: The pulmonic valve was not well visualized. Pulmonic valve regurgitation is not visualized. No evidence of pulmonic stenosis. Aorta: The aortic root is normal in size and structure. Venous: The inferior vena cava is normal in size with greater than 50% respiratory variability, suggesting right atrial pressure of 3 mmHg. IAS/Shunts: No atrial level shunt detected by color flow Doppler. Additional Comments: 3D was performed not requiring image post processing on an independent workstation and was abnormal.  LEFT VENTRICLE PLAX 2D LVIDd:         6.40 cm   Diastology LVIDs:         5.40 cm   LV e' medial:    6.42 cm/s LV PW:         1.00 cm   LV E/e' medial:  7.7 LV IVS:        0.90 cm   LV e' lateral:   7.83 cm/s LVOT diam:     2.00 cm   LV E/e' lateral: 6.3 LV SV:         45 LV SV Index:   27 LVOT Area:     3.14 cm                           3D Volume EF:                          3D EF:        44 %                          LV EDV:       194 ml  LV ESV:       109 ml                          LV SV:        85 ml RIGHT VENTRICLE RV S prime:     13.30 cm/s TAPSE (M-mode): 1.8 cm LEFT ATRIUM             Index        RIGHT ATRIUM           Index LA diam:        3.10 cm 1.84 cm/m   RA Area:     12.00 cm LA Vol (A2C):   48.1 ml 28.56 ml/m  RA Volume:   25.90 ml  15.38 ml/m LA Vol (A4C):   53.3 ml 31.64 ml/m LA Biplane Vol: 53.6 ml 31.82 ml/m  AORTIC VALVE AV Area (Vmax):    1.71 cm AV Area (Vmean):   1.64 cm AV Area (VTI):     1.78  cm AV Vmax:           144.76 cm/s AV Vmean:          96.812 cm/s AV VTI:            0.252 m AV Peak Grad:      8.4 mmHg AV Mean Grad:      4.3 mmHg LVOT Vmax:         79.00 cm/s LVOT Vmean:        50.500 cm/s LVOT VTI:          0.143 m LVOT/AV VTI ratio: 0.57  AORTA Ao Root diam: 3.50 cm MITRAL VALVE MV Area (PHT): 3.27 cm    SHUNTS MV Decel Time: 232 msec    Systemic VTI:  0.14 m MV E velocity: 49.30 cm/s  Systemic Diam: 2.00 cm MV A velocity: 89.00 cm/s MV E/A ratio:  0.55 Dorn Ross MD Electronically signed by Dorn Ross MD Signature Date/Time: 08/11/2023/1:46:21 PM    Final    DG Chest Port 1 View Result Date: 08/11/2023 CLINICAL DATA:  Chest pain. EXAM: PORTABLE CHEST 1 VIEW COMPARISON:  02/06/2020 FINDINGS: Lungs are hyperexpanded. Cardiopericardial silhouette is at upper limits of normal for size. The lungs are clear without focal pneumonia, edema, pneumothorax or pleural effusion. No acute bony abnormality. Telemetry leads overlie the chest. IMPRESSION: Hyperexpansion without acute cardiopulmonary findings. Electronically Signed   By: Camellia Candle M.D.   On: 08/11/2023 09:11   (Echo, Carotid, EGD, Colonoscopy, ERCP)    Subjective: Seen in the morning rounds.  Eager to get out of the hospital.  Discussed with cardiology at the bedside.  Denies any complaints.  Mobilizing around.   Discharge Exam: Vitals:   08/14/23 0851 08/14/23 0853  BP: 115/81 115/81  Pulse: 74 74  Resp: 18   Temp: 97.9 F (36.6 C)   SpO2: 100%    Vitals:   08/14/23 0816 08/14/23 0848 08/14/23 0851 08/14/23 0853  BP: 111/87  115/81 115/81  Pulse: 68 70 74 74  Resp: 18 17 18    Temp: 98.3 F (36.8 C) 97.9 F (36.6 C) 97.9 F (36.6 C)   TempSrc: Oral Oral Oral   SpO2: 100%  100%   Weight:      Height:        General: Pt is alert, awake, not in acute distress Cardiovascular: RRR, S1/S2 +, no rubs, no gallops Does not have any evidence of JVD.  No peripheral edema. Respiratory: CTA bilaterally,  no wheezing, no rhonchi Abdominal: Soft, NT, ND, bowel sounds + Extremities: no edema, no cyanosis    The results of significant diagnostics from this hospitalization (including imaging, microbiology, ancillary and laboratory) are listed below for reference.     Microbiology: No results found for this or any previous visit (from the past 240 hours).   Labs: BNP (last 3 results) No results for input(s): BNP in the last 8760 hours. Basic Metabolic Panel: Recent Labs  Lab 08/11/23 0922 08/12/23 0756  NA 139 137  K 3.7 4.2  CL 105 101  CO2 23 26  GLUCOSE 87 121*  BUN 16 10  CREATININE 0.85 0.85  CALCIUM 8.6* 9.3  MG  --  2.3   Liver Function Tests: Recent Labs  Lab 08/11/23 0922  AST 30  ALT 22  ALKPHOS 69  BILITOT 0.3  PROT 7.0  ALBUMIN 4.2   No results for input(s): LIPASE, AMYLASE in the last 168 hours. No results for input(s): AMMONIA in the last 168 hours. CBC: Recent Labs  Lab 08/11/23 0922 08/12/23 0756  WBC 3.0* 3.5*  NEUTROABS 1.2*  --   HGB 14.4 16.7  HCT 42.8 48.1  MCV 96.6 97.0  PLT 187 210   Cardiac Enzymes: No results for input(s): CKTOTAL, CKMB, CKMBINDEX, TROPONINI in the last 168 hours. BNP: Invalid input(s): POCBNP CBG: No results for input(s): GLUCAP in the last 168 hours. D-Dimer No results for input(s): DDIMER in the last 72 hours. Hgb A1c No results for input(s): HGBA1C in the last 72 hours. Lipid Profile Recent Labs    08/12/23 0756  CHOL 198  HDL 81  LDLCALC 101*  TRIG 79  CHOLHDL 2.4   Thyroid function studies Recent Labs    08/12/23 0756  TSH 2.489   Anemia work up No results for input(s): VITAMINB12, FOLATE, FERRITIN, TIBC, IRON, RETICCTPCT in the last 72 hours. Urinalysis No results found for: COLORURINE, APPEARANCEUR, LABSPEC, PHURINE, GLUCOSEU, HGBUR, BILIRUBINUR, KETONESUR, PROTEINUR, UROBILINOGEN, NITRITE, LEUKOCYTESUR Sepsis Labs Recent Labs   Lab 08/11/23 0922 08/12/23 0756  WBC 3.0* 3.5*   Microbiology No results found for this or any previous visit (from the past 240 hours).   Time coordinating discharge: 32 minutes  SIGNED:   Renato Applebaum, MD  [facility] 08/14/2023, 10:28 AM

## 2023-08-14 NOTE — Progress Notes (Signed)
 Discharge instructions (including medications) discussed with and copy provided to patient/caregiver

## 2023-08-14 NOTE — Plan of Care (Signed)
  Problem: Clinical Measurements: Goal: Ability to maintain clinical measurements within normal limits will improve Outcome: Progressing Goal: Will remain free from infection Outcome: Progressing Goal: Diagnostic test results will improve Outcome: Progressing Goal: Cardiovascular complication will be avoided Outcome: Progressing   Problem: Activity: Goal: Risk for activity intolerance will decrease Outcome: Progressing   Problem: Nutrition: Goal: Adequate nutrition will be maintained Outcome: Progressing

## 2023-08-14 NOTE — Progress Notes (Signed)
 Progress Note  Patient Name: Christian Ali Date of Encounter: 08/14/2023 [facility] Cardiologist: Alvan Carrier, MD   Interval Summary   Denies any recent chest or shortness of breath.  Able to ambulate in the hall.   Vital Signs Vitals:   08/13/23 1639 08/13/23 2020 08/14/23 0013 08/14/23 0517  BP: (!) 145/98 (!) 128/98 119/64 132/89  Pulse: 77 (!) 47  67  Resp: 18 18 15 16   Temp: 98.9 F (37.2 C) 98.2 F (36.8 C) 98.4 F (36.9 C) 98.3 F (36.8 C)  TempSrc: Oral Oral Oral Oral  SpO2: 99% 98%  97%  Weight:      Height:        Intake/Output Summary (Last 24 hours) at 08/14/2023 0714 Last data filed at 08/14/2023 0013 Gross per 24 hour  Intake 960 ml  Output 350 ml  Net 610 ml      08/12/2023    5:48 PM 08/11/2023    9:12 AM 02/19/2022    5:56 AM  Last 3 Weights  Weight (lbs) 126 lb 4.8 oz 137 lb 140 lb  Weight (kg) 57.289 kg 62.143 kg 63.504 kg      Telemetry/ECG  Normal sinus rhythm with rates in the 70s to 80s, frequent PVCs, ventricular bigeminy, and trigeminy.  Over the past 8 hours about 11% of beats of PVCs- Personally Reviewed  Physical Exam  GEN: No acute distress.   Neck: No JVD Cardiac: RRR, no murmurs, rubs, or gallops.  Respiratory: Clear to auscultation bilaterally. GI: Soft, nontender, non-distended  MS: No edema  Assessment & Plan  Christian Ali is a 48 y.o. male with no significant PMH but does not see physicians regularly who is being seen 08/11/2023 for the evaluation of epigastric/chest pain at the request of PA Rigney.   Epigastric/chest pain Chest pain for the past week. Had some atypical features. Occurs at rest or with exertion.  Chest pain has worsened.  High-sensitivity troponins negative.  EKG showed LVH, inferior and lateral T wave inversions that were seen on prior EKG, and no evidence of ischemia.  Cardiac cath on 08/12/2023 showed normal vessels and no CAD. LDL 101 on 08/12/2023.  LPA pending.  A1c 5.6. with no CAD and  no diabetes lipid-lowering agents are necessary at this time.     Cardiomyopathy LVEF 25-30% Echo this hospitalization showed LVEF 25 to 30%, global hypokinesis, G1DD, moderately dilated LV, normal RV systolic function, and respiratory variability of the IVC indicating normal RV pressure. Unclear etiology of his cardiomyopathy. High bp and LVH on EKG suspect possible hypertensive CM however no significant LVH by echo. Denies any heavy EtOH or drug use other than marijuana, no significant family history of heart disease. No recent viral infections. Chest pain remains fairly atypical without objective evidence of ischemia thus far but based on echo.  Cardiac cath on 08/12/2023 showed normal vessels and no CAD.  LVEDP was normal.  Cardiac MRI done this morning results pending. Needs follow-up echo in 3 months.  If LVEF continues to be reduced consider ICD. GDMT Had already ready started norvasc prior to echo results, will d/c.  Blood pressures have been elevated most recent BP 136/95. Consulted pharmacy on cost of Entresto and Farxiga and they should cost $10. Most recent BP 111/87 Continue Entresto 51-49 twice daily.  Increase to coreg 6.25mg  bid.   Start farxiga 10mg  daily. Stop Jardiance because of insurance coverage. Continue spirolactone 12.5 mg daily.     Frequent PACs Non sustained  SVT Ventricular bigeminy Telemetry shows frequent PACs and nonsustained SVT, frequent PVC's, and Ventricular bigeminy.  The patient's cardiomyopathy may be secondary to arrhythmias.  TSH normal.  On 08/12/2023 potassium 4.2, magnesium 2.3.  Recommend minimizing caffeine intake. As MRI did not find an explanation for cardiomyopathy will order outpatient cardiac monitor. Increase Coreg as per GDMT above.   HTN Prior to hospitalization was not diagnosed with hypertension but does not have a PCP.  EKG showed LVH.  No LVH mentioned on echo.  Renin/Aldo pending.  Cardiac MRI pending. Manage blood pressure medications per  GDMT above.   [facility] will sign off.   The patient is ready for discharge today from a cardiac standpoint. Medication Recommendations:  As above Other recommendations (labs, testing, etc):  cardiac monitor Follow up as an outpatient:  will schedule follow up  For questions or updates, please contact  HeartCare Please consult [email or web address] for contact info under       Signed, Robynn Marcel, PA-C

## 2023-08-18 ENCOUNTER — Telehealth (HOSPITAL_COMMUNITY): Payer: Self-pay | Admitting: *Deleted

## 2023-08-18 NOTE — Telephone Encounter (Signed)
 Called to confirm/remind patient of their appointment at the [facility] on 08/19/23.       Appointment:              [] Confirmed             [] Left mess              [x] No answer/No voice mail             [] Phone not in service   Patient reminded to bring all medications and/or complete list.   Confirmed patient has transportation. Gave directions, instructed to utilize valet parking.

## 2023-08-19 ENCOUNTER — Encounter (HOSPITAL_COMMUNITY): Payer: Self-pay

## 2023-08-19 ENCOUNTER — Ambulatory Visit (HOSPITAL_COMMUNITY): Payer: Self-pay | Admitting: Cardiology

## 2023-08-19 ENCOUNTER — Ambulatory Visit (HOSPITAL_COMMUNITY)
Admit: 2023-08-19 | Discharge: 2023-08-19 | Disposition: A | Discharge status: Home / Self Care | Source: Ambulatory Visit | Attending: Cardiology | Admitting: Cardiology

## 2023-08-19 ENCOUNTER — Other Ambulatory Visit (HOSPITAL_COMMUNITY): Payer: Self-pay

## 2023-08-19 VITALS — HR 69 | Wt 137.0 lb

## 2023-08-19 DIAGNOSIS — F17210 Nicotine dependence, cigarettes, uncomplicated: Secondary | ICD-10-CM | POA: Insufficient documentation

## 2023-08-19 DIAGNOSIS — I5022 Chronic systolic (congestive) heart failure: Secondary | ICD-10-CM | POA: Diagnosis present

## 2023-08-19 DIAGNOSIS — I5042 Chronic combined systolic (congestive) and diastolic (congestive) heart failure: Secondary | ICD-10-CM | POA: Diagnosis not present

## 2023-08-19 DIAGNOSIS — I110 Hypertensive heart disease with heart failure: Secondary | ICD-10-CM | POA: Insufficient documentation

## 2023-08-19 DIAGNOSIS — I493 Ventricular premature depolarization: Secondary | ICD-10-CM | POA: Diagnosis not present

## 2023-08-19 DIAGNOSIS — Z79899 Other long term (current) drug therapy: Secondary | ICD-10-CM | POA: Insufficient documentation

## 2023-08-19 LAB — BASIC METABOLIC PANEL WITH GFR
Anion gap: 10 (ref 5–15)
BUN: 10 mg/dL (ref 6–20)
CO2: 26 mmol/L (ref 22–32)
Calcium: 8.9 mg/dL (ref 8.9–10.3)
Chloride: 101 mmol/L (ref 98–111)
Creatinine, Ser: 0.96 mg/dL (ref 0.61–1.24)
GFR, Estimated: >60 mL/min (ref >60)
Glucose, Bld: 74 mg/dL (ref 70–99)
Potassium: 4 mmol/L (ref 3.5–5.1)
Sodium: 137 mmol/L (ref 135–145)

## 2023-08-19 MED ORDER — SPIRONOLACTONE 25 MG PO TABS
25.0000 mg | ORAL_TABLET | Freq: Every day | ORAL | 3 refills | Status: DC
  Filled 2023-08-19 – 2023-09-12 (×2): qty 90, 90d supply, fill #0

## 2023-08-19 NOTE — Progress Notes (Signed)
 ReDS Vest / Clip - 08/19/23 1414       ReDS Vest / Clip   Station Marker A    Ruler Value 29    ReDS Value Range Low volume    ReDS Actual Value 30

## 2023-08-19 NOTE — Addendum Note (Signed)
 Encounter addended by: Marcine Caffie HERO, PA-C on: 08/19/2023 3:27 PM  Actions taken: Clinical Note Signed

## 2023-08-19 NOTE — Patient Instructions (Signed)
 INCREASE Spironolactone  to 25 mg daily.  Labs done today, your results will be available in MyChart, we will contact you for abnormal readings.  Your provider has recommended that you have a home sleep study ([facility] Test).  We have provided you with the equipment in our office today. Please go ahead and download the app. DO NOT OPEN OR TAMPER WITH THE BOX UNTIL WE ADVISE YOU TO DO SO. Once insurance has approved the test our office will call you with PIN number and approval to proceed with testing. Once you have completed the test you just dispose of the equipment, the information is automatically uploaded to us  via blue-tooth technology. If your test is positive for sleep apnea and you need a home CPAP machine you will be contacted by Dr Dorine office [facility]) to set this up.   Your physician recommends that you schedule a follow-up appointment in: 3 weeks.  If you have any questions or concerns before your next appointment please send us  a message through [place] or call our office at [phone].    TO LEAVE A MESSAGE FOR THE NURSE SELECT OPTION 2, PLEASE LEAVE A MESSAGE INCLUDING: YOUR NAME DATE OF BIRTH CALL BACK NUMBER REASON FOR CALL**this is important as we prioritize the call backs  YOU WILL RECEIVE A CALL BACK THE SAME DAY AS LONG AS YOU CALL BEFORE 4:00 PM  At the [facility], you and your health needs are our priority. As part of our continuing mission to provide you with exceptional heart care, we have created designated Provider Care Teams. These Care Teams include your primary Cardiologist (physician) and Advanced Practice Providers (APPs- Physician Assistants and Nurse Practitioners) who all work together to provide you with the care you need, when you need it.   You may see any of the following providers on your designated Care Team at your next follow up: Dr Toribio Fuel Dr Ezra Shuck Dr. Ria Commander Dr. [name] Amy Lenetta,  NP Caffie Shed, [place] [facility], [place] Beckey Coe, NP [place] Lee, NP Ellouise Class, NP Tinnie Redman, PharmD Jaun Bash, PharmD   Please be sure to bring in all your medications bottles to every appointment.    Thank you for choosing [facility]

## 2023-08-19 NOTE — Progress Notes (Addendum)
 HEART & VASCULAR TRANSITION OF CARE CONSULT NOTE     Referring Physician: Dr. Delford  Primary Cardiologist: Dr. Alvan   Chief Complaint: f/u for systolic heart failure  HPI: Referred to clinic by Dr. Nishan for heart failure consultation.   Christian Ali is a 48 y.o. male w/ a PMH of untreated HTN, tobacco and ETOH use, who was admitted 6/25 for new acute systolic heart failure. Echo EF 25-30%, global hypokinesis, G1DD, RV normal, trivial MR. HS trops negative. EKGs showed LVH w/ frequent PVCs (ventricular bigeminy). BP was elevated 180s/120s. He was diuresed w/ IV Lasix and started on GDMT then underwent LHC which showed normal coronary anatomy, LVEDP 8. cMRI with severe LVE with global hypokinesis, LVEF 23%, low normal RVEF 46%, no delayed gadolinium uptake, trivial posterior basal pericardial effusion. ECV normal 27%, T2 values also normal. He was placed on 4 pillar GDMT and Zio placed at discharge to quantify PVC burden. He was referred to the [facility] at discharge.   He presents today for hospital f/u. He reports feeling better. Breathing improved. NYHA Class II. No LEE. Reports full med compliance. BP improved 112/74. Denies orthostatic symptoms. ReDs 30%, normal.   He continues to smoke cigarettes, 1 ppd. He has quit drinking ETOH. He says prior to his recent hospitalization, he was drinking on the weekends socially, typically a pint of liquor would last the weekend.   He reports h/o snoring. Has never had sleep study.   Also reports FH of HF (father).    Cardiac Testing   2D Echo 08/11/23 1. Left ventricular ejection fraction, by estimation, is 25 to 30%. The  left ventricle has severely decreased function. The left ventricle  demonstrates global hypokinesis. The left ventricular internal cavity size  was moderately dilated. Left  ventricular diastolic parameters are consistent with Grade I diastolic  dysfunction (impaired relaxation).   2. Right ventricular systolic  function is normal. The right ventricular  size is normal. Tricuspid regurgitation signal is inadequate for assessing  PA pressure.   3. The mitral valve is normal in structure. Trivial mitral valve  regurgitation. No evidence of mitral stenosis.   4. The aortic valve is tricuspid. Aortic valve regurgitation is not  visualized. No aortic stenosis is present.   5. The inferior vena cava is normal in size with greater than 50%  respiratory variability, suggesting right atrial pressure of 3 mmHg.    LHC 08/12/23   LV end diastolic pressure is normal.   Normal coronary anatomy Low LVEDP 8 mm Hg  cMRI 08/13/23  IMPRESSION: 1 Severe LVE with global hypokinesis LVEF 23% mild LVH septal thickness 12 mm   2.  Low normal RVEF with no enlargement RVEF 46%   3.  Normal parametric measures see values above   4.  No delayed gadolinium uptake on inversion recovery sequences   5.  Trivial posterior basal pericardial effusion   6.  Trivial appearing MR   7.  Estimated cardiac output using flow analysis 2.7 L/min   Past Medical History:  Diagnosis Date   H/O shoulder surgery     Current Outpatient Medications  Medication Sig Dispense Refill   carvedilol  (COREG ) 6.25 MG tablet Take 1 tablet (6.25 mg total) by mouth 2 (two) times daily with a meal. 180 tablet 0   dapagliflozin  propanediol (FARXIGA ) 10 MG TABS tablet Take 1 tablet (10 mg total) by mouth daily. 90 tablet 0   pantoprazole  (PROTONIX ) 40 MG tablet Take 1 tablet (40  mg total) by mouth daily. 90 tablet 0   sacubitril -valsartan  (ENTRESTO ) 49-51 MG Take 1 tablet by mouth 2 (two) times daily. 180 tablet 0   spironolactone  (ALDACTONE ) 25 MG tablet Take 0.5 tablets (12.5 mg total) by mouth daily. 45 tablet 0   No current facility-administered medications for this visit.    No Known Allergies    Social History   Socioeconomic History   Marital status: Single    Spouse name: Not on file   Number of children: 4   Years of  education: Not on file   Highest education level: High school graduate  Occupational History   Not on file  Tobacco Use   Smoking status: Every Day    Current packs/day: 1.00    Types: Cigarettes   Smokeless tobacco: Never  Vaping Use   Vaping status: Never Used  Substance and Sexual Activity   Alcohol use: Yes    Comment: occasionally   Drug use: Yes    Types: Marijuana    Comment: everyday   Sexual activity: Yes  Other Topics Concern   Not on file  Social History Narrative   Not on file   Social Drivers of Health   Financial Resource Strain: Low Risk  (08/13/2023)   Overall Financial Resource Strain (CARDIA)    Difficulty of Paying Living Expenses: Not very hard  Food Insecurity: No Food Insecurity (08/11/2023)   Hunger Vital Sign    Worried About Running Out of Food in the Last Year: Never true    Ran Out of Food in the Last Year: Never true  Transportation Needs: No Transportation Needs (08/13/2023)   PRAPARE - Administrator, Civil Service (Medical): No    Lack of Transportation (Non-Medical): No  Physical Activity: Not on file  Stress: Not on file  Social Connections: Not on file  Intimate Partner Violence: Not At Risk (08/11/2023)   Humiliation, Afraid, Rape, and Kick questionnaire    Fear of Current or Ex-Partner: No    Emotionally Abused: No    Physically Abused: No    Sexually Abused: No     No family history on file.  There were no vitals filed for this visit.  PHYSICAL EXAM: General:  Well appearing. No respiratory difficulty HEENT: normal Neck: supple. no JVD. Carotids 2+ bilat; no bruits. No lymphadenopathy or thryomegaly appreciated. Cor: PMI nondisplaced. Regular rate & rhythm. No rubs, gallops or murmurs. Lungs: clear Abdomen: soft, nontender, nondistended. No hepatosplenomegaly. No bruits or masses. Good bowel sounds. Extremities: no cyanosis, clubbing, rash, edema Neuro: alert & oriented x 3, cranial nerves grossly intact. moves all 4  extremities w/o difficulty. Affect pleasant.  ECG: not performed    ASSESSMENT & PLAN:  1. Chronic Systolic Heart Failure - newly diagnosed. Echo 6/25 EF 25-30%, global hypokinesis, G1DD, RV normal, trivial MR - NICM. LHC w/ normal cors - EKGs show LVH suggestive of long-standing HTN, initial BP on admission 180s/120s  - cMRI severe LVE with global hypokinesis, LVEF 23%, low normal RVEF 46%, no delayed gadolinium uptake. ECV normal 27%, T2 values also normal - suspect most likely HTN CM +/- PVC mediated +/- ETOH mediated - BP now better controlled w/ GDMT. NYHA Class II. Euvolemic on exam and by ReDs, 30% - continue Entresto  49-51 mg bid - continue Farxiga  10 mg daily  - increase spironolactone  to 25 mg daily - continue coreg  6.25 mg bid  - does not need loop diuretic currently  - plan continued med titration  q 2-3 wks until fully optimized. Repeat echo in 3 months. If EF not improving despite GDMT/ good BP control, will refer for genetic testing (father w/ h/o HF). If EF remains < 35% will refer to EP for ICD - discussed keeping ETOH intake to a minimum - refer to the [facility] to be followed   2. HTN - improved w/ GDMT. Titration per above - check BMP today   3. PVCs - frequent PVCs noted on hospital EKGs - he is currently wearing Zio to quantify burden. Will f/u on results next visit - suspect underlying OSA (h/o snoring). Refer for sleep study   4. Tobacco Use - smokes ~1 ppd - encouraged to quit  5. ETOH use - prior to recent admit, was drinking mod-heavy on weekends (averaging a pint of liquor through weekend)  - he reports he has quit. Congratulated on efforts and advised to keep to a minimum, as possible contributor to HF/HTN    Referred to HFSW (PCP, Medications, Transportation, ETOH Abuse, Drug Abuse, Insurance, Financial ): No  Refer to Pharmacy:  No Refer to Home Health:  No Refer to [facility]: Yes (assign to Dr. Zenaida)  Refer to General  Cardiology: No   Follow up  in the [facility] for continued med titration. F/u in 2-3 wks.   Caffie Shed, PA-C 08/19/2023

## 2023-08-24 LAB — ALDOSTERONE + RENIN ACTIVITY W/ RATIO
ALDO / PRA Ratio: <2.4 (ref 0.0–30.0)
Aldosterone: <1 ng/dL (ref 0.0–30.0)
PRA LC/MS/MS: 0.417 ng/mL/h (ref 0.167–5.380)

## 2023-09-09 ENCOUNTER — Ambulatory Visit (HOSPITAL_COMMUNITY)
Admission: RE | Admit: 2023-09-09 | Discharge: 2023-09-09 | Disposition: A | Discharge status: Home / Self Care | Source: Ambulatory Visit | Attending: Cardiology | Admitting: Cardiology

## 2023-09-09 ENCOUNTER — Encounter (HOSPITAL_COMMUNITY): Payer: Self-pay | Admitting: Cardiology

## 2023-09-09 VITALS — BP 104/78 | HR 61 | Ht 65.0 in | Wt 134.0 lb

## 2023-09-09 DIAGNOSIS — F17200 Nicotine dependence, unspecified, uncomplicated: Secondary | ICD-10-CM | POA: Diagnosis not present

## 2023-09-09 DIAGNOSIS — I493 Ventricular premature depolarization: Secondary | ICD-10-CM | POA: Diagnosis not present

## 2023-09-09 DIAGNOSIS — Z79899 Other long term (current) drug therapy: Secondary | ICD-10-CM | POA: Diagnosis not present

## 2023-09-09 DIAGNOSIS — I428 Other cardiomyopathies: Secondary | ICD-10-CM | POA: Insufficient documentation

## 2023-09-09 DIAGNOSIS — I5022 Chronic systolic (congestive) heart failure: Secondary | ICD-10-CM | POA: Insufficient documentation

## 2023-09-09 DIAGNOSIS — I110 Hypertensive heart disease with heart failure: Secondary | ICD-10-CM | POA: Insufficient documentation

## 2023-09-09 DIAGNOSIS — I5042 Chronic combined systolic (congestive) and diastolic (congestive) heart failure: Secondary | ICD-10-CM

## 2023-09-09 DIAGNOSIS — F1091 Alcohol use, unspecified, in remission: Secondary | ICD-10-CM | POA: Insufficient documentation

## 2023-09-09 MED ORDER — FUROSEMIDE 40 MG PO TABS: 40.0000 mg | ORAL_TABLET | ORAL | 3 refills | Status: AC | PRN

## 2023-09-09 NOTE — Progress Notes (Signed)
   ADVANCED HEART FAILURE NEW PATIENT CLINIC NOTE  Referring Physician: No ref. provider found  Primary Care: Patient, No Pcp Per Primary Cardiologist:  HPI: Christian Ali is a 48 y.o. male with a PMH of HTN, HFrEF who presents for initial visit for further evaluation and treatment of heart failure/cardiomyopathy.      PMH of untreated HTN, tobacco and ETOH use, who was admitted 6/25 for new acute systolic heart failure. Echo EF 25-30%, global hypokinesis, G1DD, RV normal, trivial MR. HS trops negative. EKGs showed LVH w/ frequent PVCs (ventricular bigeminy). BP was elevated 180s/120s. He was diuresed w/ IV Lasix  and started on GDMT then underwent LHC which showed normal coronary anatomy, LVEDP 8. cMRI with severe LVE with global hypokinesis, LVEF 23%, low normal RVEF 46%, no delayed gadolinium uptake, trivial posterior basal pericardial effusion. ECV normal 27%, T2 values also normal. He was placed on 4 pillar GDMT and Zio placed at discharge to quantify PVC burden. He was referred to the [facility] at discharge.     SUBJECTIVE:  Overall doing much better, feels that his shortness of breath has significantly improved. Denies any orthopnea, PND, swelling, chest pain, worsening shortness of breath. Does need to take things easier than he used to. No issues with his medications.   PMH, current medications, allergies, social history, and family history reviewed in epic.  PHYSICAL EXAM: Vitals:   09/09/23 1519  BP: 104/78  Pulse: 61  SpO2: 97%   GENERAL: Well nourished and in no apparent distress at rest.  PULM:  Normal work of breathing, clear to auscultation bilaterally. Respirations are unlabored.  CARDIAC:  JVP: flat         Normal rate with regular rhythm. No murmurs, rubs or gallops.  No edema. Warm and well perfused extremities. ABDOMEN: Soft, non-tender, non-distended. NEUROLOGIC: Patient is oriented x3 with no focal or lateralizing neurologic deficits.    DATA  REVIEW  ECG: 08/12/2023: NSR, LVH    ECHO: 08/11/2023: LVEF 25 to 30% moderately dilated, normal RV, no valvular issues  CATH: 08/12/2023: Normal coronary arteriography  CMR: 08/13/2023: Severe LVE with EF 23%, mild LVH, no LGE, normal ECV   Heart failure review: - Classification: Heart failure with reduced EF - Etiology: Work up ongoing - NYHA Class: I-II - Volume status: Euvolemic - ACEi/ARB/ARNI: Currently up-titrating - Aldosterone antagonist: Maximally tolerated dose - Beta-blocker: Currently up-titrating - Digoxin: Not indicated - Hydralazine /Nitrates: Not indicated - SGLT2i: Maximally tolerated dose - GLP-1: Not a candidate - Advanced therapies: Not needed at this time - ICD: Currently uptitrating GDMT   ASSESSMENT & PLAN:  Chronic systolic heart failure: NICM, potentially untreated HTN though etiology unclear.  - BP limits further titration of GDMT in clinic today - Euvolemic to dry, lasix  to prn - Continue carvedilol  6.25 mg twice daily -Continue Entresto  49/51 mg twice daily - Continue Farxiga  10 mg daily - Continue spironolactone  25 mg daily - Repeat echocardiogram ordered - Likely will need genetic testing if he does not improve with BP control  HTN - improved w/ GDMT. Titration per above - BMP checked at last visit    PVCs - frequent PVCs noted on hospital EKGs - zio patch pending   Tobacco Use - encouraged to quit   ETOH use - he reports he has quit. Congratulated on efforts and advised to keep to a minimum, as possible contributor to HF/HTN   Follow up in 3 months  Christian Brownie, MD Advanced Heart Failure Mechanical Circulatory Support 09/12/23

## 2023-09-09 NOTE — Patient Instructions (Signed)
 TAKE Lasix  40 mg as needed for weight gain of 3 lb in 24 hours or 5 lb in a week.  Your physician recommends that you schedule a follow-up appointment in: 3 months ( October) ** PLEASE CALL THE OFFICE IN SEPTEMBER TO ARRANGE YOUR FOLLOW UP APPOINTMENT.**  If you have any questions or concerns before your next appointment please send us  a message through [place] or call our office at [phone].    TO LEAVE A MESSAGE FOR THE NURSE SELECT OPTION 2, PLEASE LEAVE A MESSAGE INCLUDING: YOUR NAME DATE OF BIRTH CALL BACK NUMBER REASON FOR CALL**this is important as we prioritize the call backs  YOU WILL RECEIVE A CALL BACK THE SAME DAY AS LONG AS YOU CALL BEFORE 4:00 PM  At the [facility], you and your health needs are our priority. As part of our continuing mission to provide you with exceptional heart care, we have created designated Provider Care Teams. These Care Teams include your primary Cardiologist (physician) and Advanced Practice Providers (APPs- Physician Assistants and Nurse Practitioners) who all work together to provide you with the care you need, when you need it.   You may see any of the following providers on your designated Care Team at your next follow up: Dr Toribio Fuel Dr Ezra Shuck Dr. Ria Commander Dr. [name] Amy Lenetta, NP Caffie Shed, [place] [facility], [place] Beckey Coe, NP [place] Lee, NP Ellouise Class, NP Tinnie Redman, PharmD Jaun Bash, PharmD   Please be sure to bring in all your medications bottles to every appointment.    Thank you for choosing [facility]

## 2023-09-11 DIAGNOSIS — I471 Supraventricular tachycardia, unspecified: Secondary | ICD-10-CM

## 2023-09-11 DIAGNOSIS — I493 Ventricular premature depolarization: Secondary | ICD-10-CM

## 2023-09-11 DIAGNOSIS — I472 Ventricular tachycardia, unspecified: Secondary | ICD-10-CM

## 2023-09-11 NOTE — Addendum Note (Signed)
 Encounter addended by: Malvina Pina A on: 09/11/2023 4:07 PM  Actions taken: Imaging Exam ended

## 2023-09-12 ENCOUNTER — Other Ambulatory Visit (HOSPITAL_COMMUNITY): Payer: Self-pay

## 2023-09-12 ENCOUNTER — Other Ambulatory Visit: Payer: Self-pay

## 2023-09-12 ENCOUNTER — Ambulatory Visit: Payer: Self-pay | Admitting: Student

## 2023-09-18 ENCOUNTER — Other Ambulatory Visit (HOSPITAL_COMMUNITY): Payer: Self-pay

## 2023-09-22 ENCOUNTER — Other Ambulatory Visit (HOSPITAL_COMMUNITY): Payer: Self-pay

## 2023-09-23 ENCOUNTER — Other Ambulatory Visit (HOSPITAL_COMMUNITY): Payer: Self-pay

## 2023-10-03 ENCOUNTER — Other Ambulatory Visit (HOSPITAL_COMMUNITY): Payer: Self-pay

## 2023-10-20 NOTE — Telephone Encounter (Signed)
 Letter of results sent to pt

## 2023-11-14 ENCOUNTER — Ambulatory Visit (HOSPITAL_COMMUNITY): Admit: 2023-11-14

## 2023-12-02 ENCOUNTER — Ambulatory Visit: Admitting: Student

## 2023-12-04 ENCOUNTER — Ambulatory Visit (HOSPITAL_COMMUNITY)
Admission: RE | Admit: 2023-12-04 | Discharge: 2023-12-04 | Disposition: A | Discharge status: Home / Self Care | Source: Ambulatory Visit | Attending: Cardiology | Admitting: Cardiology

## 2023-12-04 DIAGNOSIS — I5043 Acute on chronic combined systolic (congestive) and diastolic (congestive) heart failure: Secondary | ICD-10-CM | POA: Diagnosis not present

## 2023-12-04 LAB — ECHOCARDIOGRAM COMPLETE
Area-P 1/2: 3.36 cm2
S' Lateral: 4.4 cm

## 2023-12-04 NOTE — Progress Notes (Signed)
*  PRELIMINARY RESULTS* Echocardiogram 2D Echocardiogram has been performed.  Christian Ali 12/04/2023, 11:15 AM

## 2023-12-16 NOTE — Progress Notes (Unsigned)
 Cardiology Office Note    Date:  12/17/2023  ID:  Christian Ali, DOB November 13, 1975, MRN [id number] Cardiologist: Alvan Carrier, MD AHF: Dr. Zenaida  History of Present Illness:    Christian Ali is a 48 y.o. male with past medical history of chronic HFrEF/NICM (EF 25 to 30% by echo in 07/2023 with catheterization showing normal cors), HTN, PVC's (10.8% burden by monitor in 08/2023), prior alcohol use and tobacco use who presents to the office today for 76-month follow-up.  He was last examined by Dr. Zenaida in 08/2023 and reported that his shortness of breath had significantly improved and he denied any chest pain or palpitations. It was felt that his cardiomyopathy was likely due to uncontrolled hypertension and a follow-up echocardiogram was recommended. He was continued on Coreg  6.25 mg twice daily, Entresto  49-51 mg twice daily, Farxiga  10 mg daily and Spironolactone  25 mg daily. It was recommended to arrange for genetic testing if EF did not improve with BP control. He also reported having quit consuming alcohol and continued cessation was advised.  Repeat echocardiogram in 11/2023 showed his EF had improved to 40 to 45% with global hypokinesis, normal RV function, normal PASP and trivial MR.  In talking with the patient today, he reports overall doing well since his last office visit. He has been trying to walk his dog for exercise and reports improvement in his energy level and breathing over the past few months. Still has occasional episodes of chest discomfort and palpitations but no persistent symptoms. No recent orthopnea, PND or pitting edema  He has not utilized Lasix  in several months. He was previously working for a scientist, research (medical) and is wanting to return to work if able to do so. He consumes occasional beer on the weekends but does consume almost a 2 L of Sun Drop daily. He has reduced his tobacco use from approximately 1 pack/day to 5 to 6 cigarettes/day.  Studies Reviewed:    EKG: EKG is not ordered today  Echocardiogram: 07/2023 IMPRESSIONS     1. Left ventricular ejection fraction, by estimation, is 25 to 30%. The  left ventricle has severely decreased function. The left ventricle  demonstrates global hypokinesis. The left ventricular internal cavity size  was moderately dilated. Left  ventricular diastolic parameters are consistent with Grade I diastolic  dysfunction (impaired relaxation).   2. Right ventricular systolic function is normal. The right ventricular  size is normal. Tricuspid regurgitation signal is inadequate for assessing  PA pressure.   3. The mitral valve is normal in structure. Trivial mitral valve  regurgitation. No evidence of mitral stenosis.   4. The aortic valve is tricuspid. Aortic valve regurgitation is not  visualized. No aortic stenosis is present.   5. The inferior vena cava is normal in size with greater than 50%  respiratory variability, suggesting right atrial pressure of 3 mmHg.   Cardiac Catheterization: 08/2023   LV end diastolic pressure is normal.   Normal coronary anatomy Low LVEDP 8 mm Hg   Plan: will obtain cardiac MRI. Optimize GDMT for CHF.  cMRI: 08/2023 IMPRESSION: 1 Severe LVE with global hypokinesis LVEF 23% mild LVH septal thickness 12 mm   2.  Low normal RVEF with no enlargement RVEF 46%   3.  Normal parametric measures see values above   4.  No delayed gadolinium uptake on inversion recovery sequences   5.  Trivial posterior basal pericardial effusion   6.  Trivial appearing MR   7.  Estimated  cardiac output using flow analysis 2.7 L/min  Echocardiogram: 11/2023 IMPRESSIONS     1. Left ventricular ejection fraction, by estimation, is 40 to 45%. Left  ventricular ejection fraction by 3D volume is 50 %. The left ventricle has  mildly decreased function. The left ventricle demonstrates global  hypokinesis. Left ventricular diastolic   parameters were normal.   2. Right ventricular  systolic function is normal. The right ventricular  size is normal. There is normal pulmonary artery systolic pressure.   3. Left atrial size was mild to moderately dilated.   4. The mitral valve is normal in structure. Trivial mitral valve  regurgitation. No evidence of mitral stenosis.   5. The aortic valve is tricuspid. Aortic valve regurgitation is not  visualized. No aortic stenosis is present.   6. The inferior vena cava is normal in size with greater than 50%  respiratory variability, suggesting right atrial pressure of 3 mmHg.   Comparison(s): Changes from prior study are noted. LVEF improved from  25-30% to 40-45% now.    Physical Exam:   VS:  BP 122/80 (BP Location: Right Arm, Cuff Size: Normal)   Pulse 60   Ht 5' 5 (1.651 m)   Wt 134 lb (60.8 kg)   SpO2 97%   BMI 22.30 kg/m    Wt Readings from Last 3 Encounters:  12/17/23 134 lb (60.8 kg)  09/09/23 134 lb (60.8 kg)  08/19/23 137 lb (62.1 kg)     GEN: Well nourished, well developed male appearing in no acute distress NECK: No JVD; No carotid bruits CARDIAC: RRR, no murmurs, rubs, gallops RESPIRATORY:  Clear to auscultation without rales, wheezing or rhonchi  ABDOMEN: Appears non-distended. No obvious abdominal masses. EXTREMITIES: No clubbing or cyanosis. No pitting edema.  Distal pedal pulses are 2+ bilaterally.   Assessment and Plan:   1. 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 Repeat echocardiogram last month showed his EF had improved to 40 to 45% and RV function was normal. - He reports improvement in his respiratory status and appears euvolemic by examination today. He has been without Farxiga  and Spironolactone  due to his pharmacy not filling these prescriptions. Will continue Coreg  6.25 mg twice daily and Entresto  49-51 mg  twice daily and provide refills of Farxiga  10 mg daily and Spironolactone  25 mg daily. - Will check with Advanced Heart Failure in regards to returning to work but anticipate he should be able to do so given improvement in symptoms and improvement in EF over the past several months.  2. Essential hypertension - BP is well-controlled at 122/80 today's visit. Continue current medical therapy with Coreg  6.25 mg twice daily, Entresto  49-51 mg twice daily and Spironolactone  25 mg daily.  3. PVC (premature ventricular contraction) - Prior monitor in 08/2023 showed 10.8% PVC burden. He reports occasional palpitations but no persistent symptoms. Continue Coreg  6.25 mg twice daily. Recommended reduction in caffeine consumption.   4. Tobacco Use/Alcohol Use - He has reduced his tobacco use to 5 to 6 cigarettes/day and only consumes an occasional beer on the weekends.  Congratulated on his reduction with cessation advised.  Signed, Laymon CHRISTELLA Qua, PA-C

## 2023-12-17 ENCOUNTER — Ambulatory Visit: Attending: Student | Admitting: Student

## 2023-12-17 ENCOUNTER — Encounter: Payer: Self-pay | Admitting: Student

## 2023-12-17 VITALS — BP 122/80 | HR 60 | Ht 65.0 in | Wt 134.0 lb

## 2023-12-17 DIAGNOSIS — Z720 Tobacco use: Secondary | ICD-10-CM

## 2023-12-17 DIAGNOSIS — I5020 Unspecified systolic (congestive) heart failure: Secondary | ICD-10-CM

## 2023-12-17 DIAGNOSIS — I493 Ventricular premature depolarization: Secondary | ICD-10-CM

## 2023-12-17 DIAGNOSIS — I10 Essential (primary) hypertension: Secondary | ICD-10-CM

## 2023-12-17 MED ORDER — SPIRONOLACTONE 25 MG PO TABS: 25.0000 mg | ORAL_TABLET | Freq: Every day | ORAL | 3 refills | Status: AC

## 2023-12-17 MED ORDER — DAPAGLIFLOZIN PROPANEDIOL 10 MG PO TABS: 10.0000 mg | ORAL_TABLET | Freq: Every day | ORAL | 3 refills | Status: AC

## 2023-12-17 MED ORDER — CARVEDILOL 6.25 MG PO TABS: 6.2500 mg | ORAL_TABLET | Freq: Two times a day (BID) | ORAL | 3 refills | Status: AC

## 2023-12-17 MED ORDER — SACUBITRIL-VALSARTAN 49-51 MG PO TABS: 1.0000 | ORAL_TABLET | Freq: Two times a day (BID) | ORAL | 3 refills | Status: AC

## 2023-12-17 MED ORDER — BLOOD PRESSURE KIT: 1.0000 [IU] | PACK | Freq: Every day | 0 refills | Status: AC

## 2023-12-17 NOTE — Patient Instructions (Signed)
 Medication Instructions:  Your physician recommends that you continue on your current medications as directed. Please refer to the Current Medication list given to you today.  *If you need a refill on your cardiac medications before your next appointment, please call your pharmacy*  Lab Work: NONE   If you have labs (blood work) drawn today and your tests are completely normal, you will receive your results only by: MyChart Message (if you have MyChart) OR A paper copy in the mail If you have any lab test that is abnormal or we need to change your treatment, we will call you to review the results.  Testing/Procedures: NONE   Follow-Up: At [facility], you and your health needs are our priority.  As part of our continuing mission to provide you with exceptional heart care, our providers are all part of one team.  This team includes your primary Cardiologist (physician) and Advanced Practice Providers or APPs (Physician Assistants and Nurse Practitioners) who all work together to provide you with the care you need, when you need it.  Your next appointment:   3 -4 month(s)  Provider:   Dorn Ross, MD    We recommend signing up for the patient portal called MyChart.  Sign up information is provided on this After Visit Summary.  MyChart is used to connect with patients for Virtual Visits (Telemedicine).  Patients are able to view lab/test results, encounter notes, upcoming appointments, etc.  Non-urgent messages can be sent to your provider as well.   To learn more about what you can do with MyChart, go to [email or web address].   Other Instructions Thank you for choosing [facility]!

## 2023-12-18 ENCOUNTER — Encounter: Payer: Self-pay | Admitting: Student

## 2024-01-07 ENCOUNTER — Telehealth: Payer: Self-pay | Admitting: Cardiology

## 2024-01-07 NOTE — Telephone Encounter (Signed)
 Pt requesting to come pick up a note to release him back to work.

## 2024-01-07 NOTE — Telephone Encounter (Signed)
 Pt notified that letter is printed and ready to be picked up.

## 2024-01-29 ENCOUNTER — Ambulatory Visit: Payer: Self-pay | Admitting: Cardiology

## 2024-03-31 ENCOUNTER — Ambulatory Visit: Admitting: Cardiology
